# Patient Record
Sex: Female | Born: 1970 | Race: Black or African American | Hispanic: No | Marital: Single | Smoking: Former smoker
Health system: Southern US, Community
[De-identification: ages and names within clinical notes are randomized; demographics above are authoritative.]

## PROBLEM LIST (undated history)

## (undated) DIAGNOSIS — R079 Chest pain, unspecified: Secondary | ICD-10-CM

## (undated) DIAGNOSIS — E0590 Thyrotoxicosis, unspecified without thyrotoxic crisis or storm: Secondary | ICD-10-CM

## (undated) DIAGNOSIS — I10 Essential (primary) hypertension: Secondary | ICD-10-CM

## (undated) DIAGNOSIS — R0602 Shortness of breath: Secondary | ICD-10-CM

## (undated) HISTORY — DX: Chest pain, unspecified: R07.9

## (undated) HISTORY — PX: ECTOPIC PREGNANCY SURGERY: SHX613

## (undated) HISTORY — DX: Shortness of breath: R06.02

## (undated) HISTORY — DX: Thyrotoxicosis, unspecified without thyrotoxic crisis or storm: E05.90

---

## 2019-12-20 ENCOUNTER — Emergency Department (HOSPITAL_COMMUNITY)
Admission: EM | Admit: 2019-12-20 | Discharge: 2019-12-20 | Disposition: A | Discharge status: Home / Self Care | Payer: Self-pay | Attending: Emergency Medicine | Admitting: Emergency Medicine

## 2019-12-20 ENCOUNTER — Encounter (HOSPITAL_COMMUNITY): Payer: Self-pay | Admitting: Emergency Medicine

## 2019-12-20 ENCOUNTER — Emergency Department (HOSPITAL_COMMUNITY): Payer: Self-pay

## 2019-12-20 ENCOUNTER — Other Ambulatory Visit: Payer: Self-pay

## 2019-12-20 DIAGNOSIS — I209 Angina pectoris, unspecified: Secondary | ICD-10-CM | POA: Insufficient documentation

## 2019-12-20 DIAGNOSIS — I208 Other forms of angina pectoris: Secondary | ICD-10-CM

## 2019-12-20 DIAGNOSIS — I10 Essential (primary) hypertension: Secondary | ICD-10-CM | POA: Insufficient documentation

## 2019-12-20 LAB — BASIC METABOLIC PANEL
Anion gap: 9 (ref 5–15)
BUN: 11 mg/dL (ref 6–20)
CO2: 26 mmol/L (ref 22–32)
Calcium: 9.2 mg/dL (ref 8.9–10.3)
Chloride: 103 mmol/L (ref 98–111)
Creatinine, Ser: 0.6 mg/dL (ref 0.44–1.00)
GFR calc Af Amer: >60 mL/min (ref >60)
GFR calc non Af Amer: >60 mL/min (ref >60)
Glucose, Bld: 98 mg/dL (ref 70–99)
Potassium: 3.8 mmol/L (ref 3.5–5.1)
Sodium: 138 mmol/L (ref 135–145)

## 2019-12-20 LAB — TROPONIN I (HIGH SENSITIVITY)
Troponin I (High Sensitivity): 5 ng/L (ref <18)
Troponin I (High Sensitivity): 4 ng/L (ref <18)

## 2019-12-20 LAB — CBC
HCT: 34.9 % — ABNORMAL LOW (ref 36.0–46.0)
Hemoglobin: 11.4 g/dL — ABNORMAL LOW (ref 12.0–15.0)
MCH: 24.7 pg — ABNORMAL LOW (ref 26.0–34.0)
MCHC: 32.7 g/dL (ref 30.0–36.0)
MCV: 75.7 fL — ABNORMAL LOW (ref 80.0–100.0)
Platelets: 303 10*3/uL (ref 150–400)
RBC: 4.61 MIL/uL (ref 3.87–5.11)
RDW: 14.2 % (ref 11.5–15.5)
WBC: 5.4 10*3/uL (ref 4.0–10.5)
nRBC: 0 % (ref 0.0–0.2)

## 2019-12-20 LAB — I-STAT BETA HCG BLOOD, ED (MC, WL, AP ONLY): I-stat hCG, quantitative: <5 m[IU]/mL (ref <5)

## 2019-12-20 MED ORDER — METOPROLOL TARTRATE 50 MG PO TABS: 25.0000 mg | ORAL_TABLET | Freq: Two times a day (BID) | ORAL | 0 refills | Status: DC

## 2019-12-20 MED ORDER — ASPIRIN 81 MG PO CHEW
324.0000 mg | CHEWABLE_TABLET | Freq: Once | ORAL | Status: AC
  Administered 2019-12-20: 324 mg via ORAL
  Filled 2019-12-20: qty 4

## 2019-12-20 MED ORDER — METOPROLOL TARTRATE 25 MG PO TABS
25.0000 mg | ORAL_TABLET | Freq: Once | ORAL | Status: AC
  Administered 2019-12-20: 25 mg via ORAL
  Filled 2019-12-20: qty 1

## 2019-12-20 NOTE — ED Provider Notes (Signed)
[facility] EMERGENCY DEPARTMENT Provider Note   CSN: [id number] Arrival date & time: 12/20/19  1003     History Chief Complaint  Patient presents with  . Chest Pain  . Shortness of Breath    Courtney Lam is a 49 y.o. female.  HPI   49 yo female ho thyroid disease has been out of meds about 6 months.  She reports that she thinks that she was told she had hyperthyroidism.  She is unable to recall the name of the medication that she took.  She was treated at [facility].  These records are not readily available in epic.  Today her chief complaint is that she has been having chest pain for the past 2 weeks with ambulation.  She describes it as pressure and pulsating with ambulation.  She states it is worse the farther that she walks.  It does resolve with stopping.  She denies any associated symptoms such as diaphoresis, dyspnea, nausea, or vomiting.   Initial onset of pain was more than 6 hours ago. The patient's chest pain is described as heaviness/pressure/tightness and is worse with exertion. The patient's chest pain is middle- or left-sided, is not well-localized, is not sharp and does radiate to the arms/jaw/neck. The patient does not complain of nausea and denies diaphoresis. The patient has no history of stroke, has no history of peripheral artery disease, has not smoked in the past 90 days, denies any history of treated diabetes, has no relevant family history of coronary artery disease (first degree relative at less than age 25) and has no history of hypercholesterolemia.      History reviewed. No pertinent past medical history. Hypertension  There are no problems to display for this patient.   History reviewed. No pertinent surgical history.   OB History   No obstetric history on file.     No family history on file.  Social History   Tobacco Use  . Smoking status: Never Smoker  . Smokeless tobacco: Never Used  Substance Use Topics  . Alcohol  use: Not Currently  . Drug use: Not Currently    Home Medications Prior to Admission medications   Not on File    Allergies    Patient has no allergy information on record.  Review of Systems   Review of Systems  Physical Exam Updated Vital Signs BP (!) 127/95 (BP Location: Left Arm)   Pulse 82   Temp 99.1 F (37.3 C) (Oral)   Resp 14   LMP 12/13/2019   SpO2 100%   Physical Exam  ED Results / Procedures / Treatments   Labs (all labs ordered are listed, but only abnormal results are displayed) Labs Reviewed  CBC - Abnormal; Notable for the following components:      Result Value   Hemoglobin 11.4 (*)    HCT 34.9 (*)    MCV 75.7 (*)    MCH 24.7 (*)    All other components within normal limits  BASIC METABOLIC PANEL  I-STAT BETA HCG BLOOD, ED ([facility], [facility], [facility] ONLY)  TROPONIN I (HIGH SENSITIVITY)  TROPONIN I (HIGH SENSITIVITY)    EKG EKG Interpretation  Date/Time:  Saturday December 20 2019 10:38:38 EDT Ventricular Rate:  84 PR Interval:  138 QRS Duration: 86 QT Interval:  354 QTC Calculation: 418 R Axis:   29 Text Interpretation: Normal sinus rhythm Possible Anterior infarct , age undetermined Abnormal ECG Confirmed by Margarita Grizzle [phone]) on 12/20/2019 2:30:21 PM   Radiology DG  Chest 2 View  Result Date: 12/20/2019 CLINICAL DATA:  Chest pain EXAM: CHEST - 2 VIEW COMPARISON:  None FINDINGS: The heart size and mediastinal contours are within normal limits. Both lungs are clear. The visualized skeletal structures are unremarkable. IMPRESSION: No active cardiopulmonary disease. Electronically Signed   By: Signa Kell M.D.   On: 12/20/2019 11:46    Procedures Procedures (including critical care time)  Medications Ordered in ED Medications - No data to display  ED Course  I have reviewed the triage vital signs and the nursing notes.  Pertinent labs & imaging results that were available during my care of the patient were reviewed by me and considered in my  medical decision making (see chart for details).    MDM Rules/Calculators/A&P                          49 year old female presents today with chest pain on exertion.  Resting EKG and troponin normal here.  Repeat troponin is pending.  Heart score is 4. Discussed with cardiology on-call.  Plan aspirin and beta-blocker and no exertional activity.  They will arrange for outpatient stress testing. Care discussed with patient and she will voices understanding of plan and follow up.  Final Clinical Impression(s) / ED Diagnoses Final diagnoses:  Exertional angina [facility])    Rx / DC Orders ED Discharge Orders    None       Margarita Grizzle, MD 12/20/19 1540

## 2019-12-20 NOTE — ED Notes (Signed)
Patient Alert and oriented to baseline. Stable and ambulatory to baseline. Patient verbalized understanding of the discharge instructions.  Patient belongings were taken by the patient.   

## 2019-12-20 NOTE — Care Management (Signed)
Community health and wellness in patient instructions to call on Monday for appointment.

## 2019-12-20 NOTE — ED Triage Notes (Signed)
Pt. Stated, Im new here and Ive not had my thyroid medication refilled in 6 months . I dont have a Dr. . Courtney Lam been having chest pain especially when I walk.

## 2019-12-20 NOTE — Discharge Instructions (Addendum)
Please take medication as prescribed. Take an aspirin daily. Do not do any exertional activity. Call the number on your discharge for follow up primary care. Please keep above appointment for cardiology evaluation. Return to emergency department if pain recurs and does not resolve with rest.

## 2020-01-01 ENCOUNTER — Encounter: Payer: Self-pay | Admitting: Internal Medicine

## 2020-01-01 ENCOUNTER — Other Ambulatory Visit: Payer: Self-pay

## 2020-01-01 ENCOUNTER — Ambulatory Visit (INDEPENDENT_AMBULATORY_CARE_PROVIDER_SITE_OTHER): Payer: Self-pay | Admitting: Internal Medicine

## 2020-01-01 VITALS — BP 124/68 | HR 78 | Ht 66.0 in | Wt 224.4 lb

## 2020-01-01 DIAGNOSIS — I209 Angina pectoris, unspecified: Secondary | ICD-10-CM | POA: Insufficient documentation

## 2020-01-01 DIAGNOSIS — E0590 Thyrotoxicosis, unspecified without thyrotoxic crisis or storm: Secondary | ICD-10-CM | POA: Insufficient documentation

## 2020-01-01 DIAGNOSIS — R0602 Shortness of breath: Secondary | ICD-10-CM | POA: Insufficient documentation

## 2020-01-01 DIAGNOSIS — R071 Chest pain on breathing: Secondary | ICD-10-CM | POA: Insufficient documentation

## 2020-01-01 LAB — LIPID PANEL
Chol/HDL Ratio: 3.3 ratio (ref 0.0–4.4)
Cholesterol, Total: 114 mg/dL (ref 100–199)
HDL: 35 mg/dL — ABNORMAL LOW (ref >39)
LDL Chol Calc (NIH): 68 mg/dL (ref 0–99)
Triglycerides: 47 mg/dL (ref 0–149)
VLDL Cholesterol Cal: 11 mg/dL (ref 5–40)

## 2020-01-01 MED ORDER — PANTOPRAZOLE SODIUM 40 MG PO TBEC: 40.0000 mg | DELAYED_RELEASE_TABLET | Freq: Every day | ORAL | 11 refills | Status: DC

## 2020-01-01 NOTE — Progress Notes (Signed)
Cardiology Office Note:    Date:  01/01/2020   ID:  Courtney Lam, DOB Jan 12, 1971, MRN [id number]  PCP:  Patient, No Pcp Per  [facility] Cardiologist:  No primary care provider on file.  [facility] Electrophysiologist:  None   Referring MD: No ref. provider found; None per patient  No chief complaint on file. Chest Pain  History of Present Illness:    Courtney Lam is a 49 y.o. female who presents after recent evaluation 12/20/19 for angina.  Patient notes that she carries a recent diagnosis of hyperthyroidism and one of her symptoms was chest pain.  Patient was getting medication for hyperthyroidism and chest pain resolved.  May or may not have been on methimazole.  Her pain wakes her from sleep.  A "hurting pain," that occurs with each heart beat.   Chest pain takes her breath away and can make her cough.  Does not have SOB, DOE, or cough outside of cough.  Also has occasional dysphagia with spicy foods but not with soft foods.  Prior notes: Anginal eval at [facility].  Pressure and pulsating worse with exertion. Eval through [facility]  hscTrop 5, 4.  Past Medical History:  Diagnosis Date  . Chest pain   . Hyperthyroidism   . SOB (shortness of breath)     Past Surgical History:  Procedure Laterality Date  . ECTOPIC PREGNANCY SURGERY      Current Medications: Current Meds  Medication Sig  . amLODipine (NORVASC) 5 MG tablet Take by mouth.  . levocetirizine (XYZAL) 5 MG tablet Take 5 mg by mouth at bedtime as needed.  . metoprolol tartrate (LOPRESSOR) 50 MG tablet Take 0.5 tablets (25 mg total) by mouth 2 (two) times daily.  Marland Kitchen triamcinolone ointment (KENALOG) 0.1 % Apply topically.     Allergies:   Patient has no known allergies.   Social History   Socioeconomic History  . Marital status: Single    Spouse name: Not on file  . Number of children: Not on file  . Years of education: Not on file  . Highest education level: Not on file  Occupational History  . Not  on file  Tobacco Use  . Smoking status: Former Games developer  . Smokeless tobacco: Never Used  Substance and Sexual Activity  . Alcohol use: Not Currently  . Drug use: Not Currently  . Sexual activity: Not on file  Other Topics Concern  . Not on file  Social History Narrative  . Not on file   Social Determinants of Health   Financial Resource Strain:   . Difficulty of Paying Living Expenses: Not on file  Food Insecurity:   . Worried About Programme researcher, broadcasting/film/video in the Last Year: Not on file  . Ran Out of Food in the Last Year: Not on file  Transportation Needs:   . Lack of Transportation (Medical): Not on file  . Lack of Transportation (Non-Medical): Not on file  Physical Activity:   . Days of Exercise per Week: Not on file  . Minutes of Exercise per Session: Not on file  Stress:   . Feeling of Stress : Not on file  Social Connections:   . Frequency of Communication with Friends and Family: Not on file  . Frequency of Social Gatherings with Friends and Family: Not on file  . Attends Religious Services: Not on file  . Active Member of Clubs or Organizations: Not on file  . Attends Banker Meetings: Not on file  .  Marital Status: Not on file     Family History: The patient's family history includes Asthma in her sister; Cancer (age of onset: 53) in her mother; Diabetes in her father; High blood pressure in her father; Thyroid disease in her brother and sister. Denies history of heart disease in family, but not sure if her father had PCI in the last year (has many medical problems).  ROS:   Please see the history of present illness.   Patient also notes productive shortness of breath and a cough that is chronic. No COVID exposures. All other systems reviewed and are negative.  EKGs/Labs/Other Studies Reviewed:    The following studies were reviewed today:  EKG:  EKG is sinus rhythm, sinus rhythm, rate 78 possible anterior infarct pattern, borderline LVH.   8/30 EKG  sinus rhythm rate 80, LVH, anterior infarct pattern  Recent Labs: 12/20/2019: BUN 11; Creatinine, Ser 0.60; Hemoglobin 11.4; Platelets 303; Potassium 3.8; Sodium 138  Recent Lipid Panel No results found for: CHOL, TRIG, HDL, CHOLHDL, VLDL, LDLCALC, LDLDIRECT  Physical Exam:    VS:  BP 124/68 (BP Location: Left Arm, Patient Position: Sitting, Cuff Size: Large)   Pulse 78   Ht 5\' 6"  (1.676 m)   Wt 224 lb 6.4 oz (101.8 kg)   LMP 12/13/2019   SpO2 100%   BMI 36.22 kg/m     Wt Readings from Last 3 Encounters:  01/01/20 224 lb 6.4 oz (101.8 kg)    GEN: Obese Well nourished, well developed in no acute distress HEENT: Normal NECK: No JVD; No carotid bruits LYMPHATICS: No lymphadenopathy CARDIAC: RRR,  III/VI holosystolic murmur heard throughout, radiates to axilla and neck RESPIRATORY:  Clear to auscultation without rales, wheezing or rhonchi  ABDOMEN: Soft, non-tender, non-distended MUSCULOSKELETAL:  No edema; No deformity  SKIN: Warm and dry NEUROLOGIC:  Alert and oriented x 3 PSYCHIATRIC:  Normal affect   ASSESSMENT:    1. Angina pectoris (HCC)   2. Chest pain on breathing   3. Shortness of breath    PLAN:    In order of problems listed above:  1. Anginal Chest pain - in the setting of essential hypertension on norvasc; hx of MI in father, hyperthyroidism - would recommend non-invasive testing but due to social determinants of health/lack of insurance, we are working to help her get insurance support. - will check lipid panel - will trial PPI - will get her plugged in with community health and attempt [facility] Orange Card as she would not be able to afford stress testing at this time 2. Shortness of breath with new systolic murmur - will get echocardiogram; currently euvolemic:  No new changes in medications at this time; getting echo after orange card 3.  Essential hypertension - controlled on present medications 4. Hyperthyroidism - getting established with PCP and  Endo  Will see in 3 months; 60 minutes spent coordinating care including support with lack of financial resources.   Medication Adjustments/Labs and Tests Ordered: Current medicines are reviewed at length with the patient today.  Concerns regarding medicines are outlined above.  Orders Placed This Encounter  Procedures  . CT CORONARY MORPH W/CTA COR W/SCORE W/CA W/CM &/OR WO/CM  . CT CORONARY FRACTIONAL FLOW RESERVE DATA PREP  . CT CORONARY FRACTIONAL FLOW RESERVE FLUID ANALYSIS  . EKG 12-Lead  . ECHOCARDIOGRAM COMPLETE   No orders of the defined types were placed in this encounter.   There are no Patient Instructions on file for this visit.  Signed, Christell Constant, MD  01/01/2020 8:46 AM    [facility]

## 2020-01-01 NOTE — Patient Instructions (Addendum)
Medication Instructions:  Your physician has recommended you make the following change in your medication:  1. Start Protonix one tablet by mouth (40 mg ) daily. Sent in today # 30 to pharmacy.    *If you need a refill on your cardiac medications before your next appointment, please call your pharmacy*   Lab Work: Your physician recommends that you have lab work today: lipid  If you have labs (blood work) drawn today and your tests are completely normal, you will receive your results only by: Marland Kitchen MyChart Message (if you have MyChart) OR . A paper copy in the mail If you have any lab test that is abnormal or we need to change your treatment, we will call you to review the results.    Follow-Up: At [facility], you and your health needs are our priority.  As part of our continuing mission to provide you with exceptional heart care, we have created designated Provider Care Teams.  These Care Teams include your primary Cardiologist (physician) and Advanced Practice Providers (APPs -  Physician Assistants and Nurse Practitioners) who all work together to provide you with the care you need, when you need it.  We recommend signing up for the patient portal called "MyChart".  Sign up information is provided on this After Visit Summary.  MyChart is used to connect with patients for Virtual Visits (Telemedicine).  Patients are able to view lab/test results, encounter notes, upcoming appointments, etc.  Non-urgent messages can be sent to your provider as well.   To learn more about what you can do with MyChart, go to [email or web address].    Your next appointment:   3 month(s)  The format for your next appointment:   In Person  Provider:   Dr. Izora Ribas   Other Instructions Your physician has requested that you have an echocardiogram. Echocardiography is a painless test that uses sound waves to create images of your heart. It provides your doctor with information about the size and  shape of your heart and how well your heart's chambers and valves are working. This procedure takes approximately one hour. There are no restrictions for this procedure.

## 2020-01-02 ENCOUNTER — Encounter: Payer: Self-pay | Admitting: *Deleted

## 2020-01-05 ENCOUNTER — Telehealth: Payer: Self-pay | Admitting: General Practice

## 2020-01-05 NOTE — Telephone Encounter (Signed)
Copied from CRM [id number]. Topic: Appointment Scheduling - Scheduling Inquiry for Clinic >> Jan 05, 2020 12:09 PM Wyonia Hough E wrote: Reason for CRM: pt would like an appt with Courtney Lam for financial assistance due to not having insurance / please advise

## 2020-01-05 NOTE — Telephone Encounter (Signed)
I return pt called, 1st Pt need to be an establish Pt with the clinic to apply for the [facility] financial programs, an her appt is not until 02/2020, she need to see the PCP fisrt

## 2020-01-16 ENCOUNTER — Other Ambulatory Visit (HOSPITAL_COMMUNITY): Payer: Medicaid Other

## 2020-01-17 ENCOUNTER — Other Ambulatory Visit: Payer: Self-pay

## 2020-01-17 ENCOUNTER — Emergency Department (HOSPITAL_COMMUNITY)
Admission: EM | Admit: 2020-01-17 | Discharge: 2020-01-17 | Disposition: A | Discharge status: Home / Self Care | Payer: Medicaid Other | Attending: Emergency Medicine | Admitting: Emergency Medicine

## 2020-01-17 ENCOUNTER — Emergency Department (HOSPITAL_COMMUNITY): Payer: Medicaid Other

## 2020-01-17 DIAGNOSIS — R079 Chest pain, unspecified: Secondary | ICD-10-CM | POA: Insufficient documentation

## 2020-01-17 DIAGNOSIS — Z5321 Procedure and treatment not carried out due to patient leaving prior to being seen by health care provider: Secondary | ICD-10-CM | POA: Insufficient documentation

## 2020-01-17 LAB — BASIC METABOLIC PANEL
Anion gap: 10 (ref 5–15)
BUN: 14 mg/dL (ref 6–20)
CO2: 24 mmol/L (ref 22–32)
Calcium: 8.9 mg/dL (ref 8.9–10.3)
Chloride: 105 mmol/L (ref 98–111)
Creatinine, Ser: 0.5 mg/dL (ref 0.44–1.00)
GFR calc Af Amer: >60 mL/min (ref >60)
GFR calc non Af Amer: >60 mL/min (ref >60)
Glucose, Bld: 111 mg/dL — ABNORMAL HIGH (ref 70–99)
Potassium: 4 mmol/L (ref 3.5–5.1)
Sodium: 139 mmol/L (ref 135–145)

## 2020-01-17 LAB — CBC
HCT: 32.8 % — ABNORMAL LOW (ref 36.0–46.0)
Hemoglobin: 10.8 g/dL — ABNORMAL LOW (ref 12.0–15.0)
MCH: 25.2 pg — ABNORMAL LOW (ref 26.0–34.0)
MCHC: 32.9 g/dL (ref 30.0–36.0)
MCV: 76.5 fL — ABNORMAL LOW (ref 80.0–100.0)
Platelets: 312 10*3/uL (ref 150–400)
RBC: 4.29 MIL/uL (ref 3.87–5.11)
RDW: 13.9 % (ref 11.5–15.5)
WBC: 6.8 10*3/uL (ref 4.0–10.5)
nRBC: 0 % (ref 0.0–0.2)

## 2020-01-17 LAB — TROPONIN I (HIGH SENSITIVITY): Troponin I (High Sensitivity): 4 ng/L (ref <18)

## 2020-01-17 NOTE — ED Notes (Signed)
Pt states she has to watch her daughters daughter so she will just go to urgent care later

## 2020-01-17 NOTE — ED Triage Notes (Signed)
Pt said she has hyperthyroidism and for 1 month Pt said the chest pain has gotten worse and starts in her throat down to her chest. Pt said no N/V. Pt said the pain does not radiate.

## 2020-02-21 ENCOUNTER — Emergency Department (HOSPITAL_COMMUNITY): Payer: Medicaid Other

## 2020-02-21 ENCOUNTER — Emergency Department (HOSPITAL_COMMUNITY)
Admission: EM | Admit: 2020-02-21 | Discharge: 2020-02-21 | Disposition: A | Discharge status: Home / Self Care | Payer: Medicaid Other | Attending: Emergency Medicine | Admitting: Emergency Medicine

## 2020-02-21 ENCOUNTER — Encounter (HOSPITAL_COMMUNITY): Payer: Self-pay | Admitting: Emergency Medicine

## 2020-02-21 ENCOUNTER — Other Ambulatory Visit: Payer: Self-pay

## 2020-02-21 DIAGNOSIS — Z79899 Other long term (current) drug therapy: Secondary | ICD-10-CM | POA: Insufficient documentation

## 2020-02-21 DIAGNOSIS — E0590 Thyrotoxicosis, unspecified without thyrotoxic crisis or storm: Secondary | ICD-10-CM

## 2020-02-21 DIAGNOSIS — E049 Nontoxic goiter, unspecified: Secondary | ICD-10-CM

## 2020-02-21 DIAGNOSIS — E0500 Thyrotoxicosis with diffuse goiter without thyrotoxic crisis or storm: Secondary | ICD-10-CM | POA: Insufficient documentation

## 2020-02-21 DIAGNOSIS — R0789 Other chest pain: Secondary | ICD-10-CM | POA: Insufficient documentation

## 2020-02-21 DIAGNOSIS — Z87891 Personal history of nicotine dependence: Secondary | ICD-10-CM | POA: Insufficient documentation

## 2020-02-21 HISTORY — DX: Essential (primary) hypertension: I10

## 2020-02-21 LAB — HEPATIC FUNCTION PANEL
ALT: 22 U/L (ref 0–44)
AST: 24 U/L (ref 15–41)
Albumin: 3 g/dL — ABNORMAL LOW (ref 3.5–5.0)
Alkaline Phosphatase: 80 U/L (ref 38–126)
Bilirubin, Direct: 0.2 mg/dL (ref 0.0–0.2)
Indirect Bilirubin: 0.6 mg/dL (ref 0.3–0.9)
Total Bilirubin: 0.8 mg/dL (ref 0.3–1.2)
Total Protein: 6.8 g/dL (ref 6.5–8.1)

## 2020-02-21 LAB — BASIC METABOLIC PANEL
Anion gap: 11 (ref 5–15)
BUN: 11 mg/dL (ref 6–20)
CO2: 24 mmol/L (ref 22–32)
Calcium: 8.9 mg/dL (ref 8.9–10.3)
Chloride: 104 mmol/L (ref 98–111)
Creatinine, Ser: 0.47 mg/dL (ref 0.44–1.00)
GFR, Estimated: >60 mL/min (ref >60)
Glucose, Bld: 104 mg/dL — ABNORMAL HIGH (ref 70–99)
Potassium: 3.1 mmol/L — ABNORMAL LOW (ref 3.5–5.1)
Sodium: 139 mmol/L (ref 135–145)

## 2020-02-21 LAB — CBC
HCT: 30.8 % — ABNORMAL LOW (ref 36.0–46.0)
Hemoglobin: 10.1 g/dL — ABNORMAL LOW (ref 12.0–15.0)
MCH: 24.1 pg — ABNORMAL LOW (ref 26.0–34.0)
MCHC: 32.8 g/dL (ref 30.0–36.0)
MCV: 73.5 fL — ABNORMAL LOW (ref 80.0–100.0)
Platelets: 240 10*3/uL (ref 150–400)
RBC: 4.19 MIL/uL (ref 3.87–5.11)
RDW: 14.1 % (ref 11.5–15.5)
WBC: 6.2 10*3/uL (ref 4.0–10.5)
nRBC: 0 % (ref 0.0–0.2)

## 2020-02-21 LAB — I-STAT BETA HCG BLOOD, ED (MC, WL, AP ONLY): I-stat hCG, quantitative: <5 m[IU]/mL (ref <5)

## 2020-02-21 LAB — BRAIN NATRIURETIC PEPTIDE: B Natriuretic Peptide: 414.3 pg/mL — ABNORMAL HIGH (ref 0.0–100.0)

## 2020-02-21 LAB — TROPONIN I (HIGH SENSITIVITY)
Troponin I (High Sensitivity): 6 ng/L (ref <18)
Troponin I (High Sensitivity): 5 ng/L (ref <18)

## 2020-02-21 LAB — TSH: TSH: <0.01 u[IU]/mL — ABNORMAL LOW (ref 0.350–4.500)

## 2020-02-21 LAB — T4, FREE: Free T4: >5.5 ng/dL — ABNORMAL HIGH (ref 0.61–1.12)

## 2020-02-21 MED ORDER — IOHEXOL 300 MG/ML  SOLN
75.0000 mL | Freq: Once | INTRAMUSCULAR | Status: AC | PRN
  Administered 2020-02-21: 75 mL via INTRAVENOUS

## 2020-02-21 MED ORDER — POTASSIUM CHLORIDE CRYS ER 20 MEQ PO TBCR
40.0000 meq | EXTENDED_RELEASE_TABLET | Freq: Once | ORAL | Status: AC
  Administered 2020-02-21: 40 meq via ORAL
  Filled 2020-02-21: qty 2

## 2020-02-21 MED ORDER — LIDOCAINE VISCOUS HCL 2 % MT SOLN
15.0000 mL | Freq: Once | OROMUCOSAL | Status: AC
  Administered 2020-02-21: 15 mL via ORAL
  Filled 2020-02-21: qty 15

## 2020-02-21 MED ORDER — ALUM & MAG HYDROXIDE-SIMETH 200-200-20 MG/5ML PO SUSP
30.0000 mL | Freq: Once | ORAL | Status: AC
  Administered 2020-02-21: 30 mL via ORAL
  Filled 2020-02-21: qty 30

## 2020-02-21 MED ORDER — FAMOTIDINE 20 MG PO TABS: 20.0000 mg | ORAL_TABLET | Freq: Two times a day (BID) | ORAL | 0 refills | Status: DC

## 2020-02-21 MED ORDER — FAMOTIDINE 20 MG PO TABS
20.0000 mg | ORAL_TABLET | Freq: Once | ORAL | Status: AC
  Administered 2020-02-21: 20 mg via ORAL
  Filled 2020-02-21: qty 1

## 2020-02-21 MED ORDER — METOPROLOL TARTRATE 50 MG PO TABS: 25.0000 mg | ORAL_TABLET | Freq: Two times a day (BID) | ORAL | 0 refills | Status: DC

## 2020-02-21 NOTE — ED Provider Notes (Signed)
[facility] EMERGENCY DEPARTMENT Provider Note   CSN: [id number] Arrival date & time: 02/21/20  4098     History Chief Complaint  Patient presents with  . Chest Pain    Courtney Lam is a 49 y.o. female.  HPI Patient is a 49 year old female who presents to the emergency department due to chest pain.  Patient reports to a history of similar symptoms.  She states her symptoms typically occur with exertion and seem to occur on a daily basis.  States the pain is in her central chest.  Also notes it worsens when lying flat.  Symptoms also seem to worsen at night when asleep in bed.  Reports increased coughing when eating recently.  She was evaluated for this in the past and started on metoprolol but denies any significant improvement.  She states she was on another medication for her hyperthyroidism but is unsure of the name of this medication.  She states that this used to help her chest pain.  She moved to this region about 6 months ago and we do not readily have her old medical records available.  She has been attempting to get an appointment with [facility] community [facility] wellness and has an upcoming appointment on November 18.  She denies any current chest pain.  No shortness of breath, abdominal pain, fevers, chills, sore throat, congestion, vomiting, diarrhea, dysuria, syncope.  Patient was evaluated by cardiology on September 9 and was started on Protonix but patient denies that she has ever taken this medication.     Past Medical History:  Diagnosis Date  . Chest pain   . Hyperthyroidism   . SOB (shortness of breath)     Patient Active Problem List   Diagnosis Date Noted  . Chest pain on breathing 01/01/2020  . Angina pectoris (HCC) 01/01/2020  . Shortness of breath 01/01/2020  . Hyperthyroidism 01/01/2020    Past Surgical History:  Procedure Laterality Date  . ECTOPIC PREGNANCY SURGERY       OB History   No obstetric history on file.     Family  History  Problem Relation Age of Onset  . Cancer Mother 76  . High blood pressure Father   . Diabetes Father   . Asthma Sister   . Thyroid disease Brother   . Thyroid disease Sister    Social History   Tobacco Use  . Smoking status: Former Games developer  . Smokeless tobacco: Never Used  Substance Use Topics  . Alcohol use: Not Currently  . Drug use: Not Currently    Home Medications Prior to Admission medications   Medication Sig Start Date End Date Taking? Authorizing Provider  amLODipine (NORVASC) 5 MG tablet Take by mouth. 08/11/19   [provider]  levocetirizine (XYZAL) 5 MG tablet Take 5 mg by mouth at bedtime as needed. 11/03/19   [provider]  metoprolol tartrate (LOPRESSOR) 50 MG tablet Take 0.5 tablets (25 mg total) by mouth 2 (two) times daily. 12/20/19   Margarita Grizzle, MD  pantoprazole (PROTONIX) 40 MG tablet Take 1 tablet (40 mg total) by mouth daily. 01/01/20   Chandrasekhar, Mahesh A, MD  triamcinolone ointment (KENALOG) 0.1 % Apply topically. 08/11/19   [provider]    Allergies    Patient has no known allergies.  Review of Systems   Review of Systems  All other systems reviewed and are negative. Ten systems reviewed and are negative for acute change, except as noted in the HPI.  Physical Exam Updated Vital Signs BP (!) 151/54 (BP Location: Right Arm)   Pulse 74   Temp 98.3 F (36.8 C) (Oral)   Resp 16   SpO2 100%   Physical Exam Vitals and nursing note reviewed.  Constitutional:      General: She is not in acute distress.    Appearance: Normal appearance. She is well-developed. She is not ill-appearing, toxic-appearing or diaphoretic.  HENT:     Head: Normocephalic and atraumatic.     Right Ear: External ear normal.     Left Ear: External ear normal.     Nose: Nose normal.     Mouth/Throat:     Mouth: Mucous membranes are moist.     Pharynx: Oropharynx is clear. No oropharyngeal exudate or posterior oropharyngeal  erythema.  Eyes:     Extraocular Movements: Extraocular movements intact.  Cardiovascular:     Rate and Rhythm: Normal rate and regular rhythm.     Pulses: Normal pulses.          Radial pulses are 2+ on the right side and 2+ on the left side.       Dorsalis pedis pulses are 2+ on the right side and 2+ on the left side.     Heart sounds: Murmur heard.  Systolic murmur is present.  No friction rub. No gallop.   Pulmonary:     Effort: Pulmonary effort is normal. No tachypnea, accessory muscle usage or respiratory distress.     Breath sounds: Normal breath sounds. No stridor. No decreased breath sounds, wheezing, rhonchi or rales.  Abdominal:     General: Abdomen is flat.     Palpations: Abdomen is soft.     Tenderness: There is no abdominal tenderness.  Musculoskeletal:        General: Normal range of motion.     Cervical back: Normal range of motion and neck supple. No tenderness.     Right lower leg: Edema present.     Left lower leg: Edema present.     Comments: 1+ non pitting symmetrical edema noted in the BLEs.  Skin:    General: Skin is warm and dry.  Neurological:     General: No focal deficit present.     Mental Status: She is alert and oriented to person, place, and time.  Psychiatric:        Mood and Affect: Mood normal.        Behavior: Behavior normal.    ED Results / Procedures / Treatments   Labs (all labs ordered are listed, but only abnormal results are displayed) Labs Reviewed  BASIC METABOLIC PANEL - Abnormal; Notable for the following components:      Result Value   Potassium 3.1 (*)    Glucose, Bld 104 (*)    All other components within normal limits  CBC - Abnormal; Notable for the following components:   Hemoglobin 10.1 (*)    HCT 30.8 (*)    MCV 73.5 (*)    MCH 24.1 (*)    All other components within normal limits  TSH - Abnormal; Notable for the following components:   TSH <0.010 (*)    All other components within normal limits  T4, FREE -  Abnormal; Notable for the following components:   Free T4 >5.50 (*)    All other components within normal limits  BRAIN NATRIURETIC PEPTIDE - Abnormal; Notable for the following components:   B Natriuretic Peptide 414.3 (*)    All other components within  normal limits  HEPATIC FUNCTION PANEL - Abnormal; Notable for the following components:   Albumin 3.0 (*)    All other components within normal limits  I-STAT BETA HCG BLOOD, ED ([facility], [facility], [facility] ONLY)  TROPONIN I (HIGH SENSITIVITY)  TROPONIN I (HIGH SENSITIVITY)   EKG EKG Interpretation  Date/Time:  Saturday February 21 2020 09:50:19 EDT Ventricular Rate:  82 PR Interval:  144 QRS Duration: 80 QT Interval:  364 QTC Calculation: 425 R Axis:   52 Text Interpretation: Normal sinus rhythm Possible Anterior infarct , age undetermined Abnormal ECG Confirmed by Kristine RoyalMessick, Peter [phone]([id number]) on 02/21/2020 10:56:05 AM  Radiology DG Chest 2 View  Addendum Date: 02/21/2020   ADDENDUM REPORT: 02/21/2020 10:57 ADDENDUM: These results were called by telephone at the time of interpretation on 02/21/2020 at 10:57 am to provider Kristine RoyalPeter Messick, who verbally acknowledged these results. Electronically Signed   By: Richarda OverlieAdam  Henn M.D.   On: 02/21/2020 10:57   Result Date: 02/21/2020 CLINICAL DATA:  Chest pain. EXAM: CHEST - 2 VIEW COMPARISON:  01/17/2020 and 12/20/2019 FINDINGS: Increased soft tissue density in the azygos vein region compared to the exam from 12/20/2019. Findings could be related to vascular congestion but difficult to exclude soft tissue thickening or lymphadenopathy in this area. Heart size is within normal limits. No overt pulmonary edema. No focal airspace disease. No pleural effusions. IMPRESSION: Increased soft tissue fullness in the right paratracheal region and azygos shadow region. This findings could be related to vascular congestion but cannot exclude lymphadenopathy. Recommend further characterization with a chest CT with IV contrast.  Electronically Signed: By: Richarda OverlieAdam  Henn M.D. On: 02/21/2020 10:48   CT Chest W Contrast  Result Date: 02/21/2020 CLINICAL DATA:  Neck mass.  Initial workup. EXAM: CT CHEST WITH CONTRAST TECHNIQUE: Multidetector CT imaging of the chest was performed during intravenous contrast administration. CONTRAST:  75mL OMNIPAQUE IOHEXOL 300 MG/ML  SOLN COMPARISON:  Chest x-ray on 10/30 21 FINDINGS: Cardiovascular: Heart size is normal. There is minimal atherosclerotic calcification of the coronary arteries. No pericardial effusion. The thoracic aorta and pulmonary arteries have normal appearance accounting for the contrast bolus timing. Mediastinum/Nodes: There is soft tissue density in the prevascular region consistent with thymic enlargement or adenopathy. This density measures 2.2 x 5.5 centimeters on image 75 of series 3. No significant hilar or axillary adenopathy. The thyroid is enlarged, measuring at least 5.4 x 6.2 centimeters with thickened isthmus. No discrete thyroid lesion identified. Thyroid is not contiguous with the substernal mass. Esophagus is normal in appearance. Lungs/Pleura: There is minimal subsegmental atelectasis at the LEFT lung base. No pleural effusions or consolidations. No suspicious nodules. No edema. Upper Abdomen: No acute abnormality. Musculoskeletal: No chest wall abnormality. No acute or significant osseous findings. IMPRESSION: 1. Enlarged thyroid gland with thickened isthmus. No discrete thyroid lesion identified. Findings are consistent with goiter. 2. Recommend thyroid US (ref: J Am Coll Radiol. 2015 Feb;12(2): 143-50). 3. Thymic enlargement or adenopathy in the anterior mediastinum. Recommend further evaluation with PET-CT. 4. Consider further evaluation with thyroid ultrasound. 5. Coronary artery disease. 6. Aortic Atherosclerosis (ICD10-I70.0). Electronically Signed   By: Norva PavlovElizabeth  Brown M.D.   On: 02/21/2020 12:32   US THYROID  Result Date: 02/21/2020 CLINICAL DATA:  Goiter  EXAM: THYROID ULTRASOUND TECHNIQUE: Ultrasound examination of the thyroid gland and adjacent soft tissues was performed. COMPARISON:  None. FINDINGS: Parenchymal Echotexture: Markedly heterogenous Isthmus: 1.3 cm Right lobe: 5.6 x 3.1 x 2.6 cm Left lobe: 6.3 x 3.3 x 2.4 cm _________________________________________________________  Estimated total number of nodules >/= 1 cm: 0 Number of spongiform nodules >/=  2 cm not described below (TR1): 0 Number of mixed cystic and solid nodules >/= 1.5 cm not described below (TR2): 0 _________________________________________________________ No discrete nodules are seen within the thyroid gland. IMPRESSION: Markedly heterogeneous and enlarged thyroid gland without evidence for distinct thyroid nodule. The above is in keeping with the ACR TI-RADS recommendations - J Am Coll Radiol 2017;14:587-595. Electronically Signed   By: Katherine Mantle M.D.   On: 02/21/2020 15:19   Procedures Procedures   Medications Ordered in ED Medications  famotidine (PEPCID) tablet 20 mg (20 mg Oral Given 02/21/20 1110)  alum & mag hydroxide-simeth (MAALOX/MYLANTA) 200-200-20 MG/5ML suspension 30 mL (30 mLs Oral Given 02/21/20 1111)    And  lidocaine (XYLOCAINE) 2 % viscous mouth solution 15 mL (15 mLs Oral Given 02/21/20 1111)   ED Course  I have reviewed the triage vital signs and the nursing notes.  Pertinent labs & imaging results that were available during my care of the patient were reviewed by me and considered in my medical decision making (see chart for details).  Clinical Course as of Feb 20 1510  Sat Feb 21, 2020  1049 Hemoglobin(!): 10.1 [LJ]  1209 Will give Klor-Con  Potassium(!): 3.1 [LJ]  1209 TSH(!): <0.010 [LJ]  1235 T4,Free(Direct)(!): >5.50 [LJ]  1235 B Natriuretic Peptide(!): 414.3 [LJ]  1446 Troponin I (High Sensitivity): 5 [LJ]    Clinical Course User Index [LJ] Placido Sou, PA-C   MDM Rules/Calculators/A&P                          Patient is  a 49 year old female who presents to the emergency department with intermittent chest pain.  Symptoms have been ongoing for a long time.  States they happen on a daily basis.  Seem to worsen with exertion but also when lying flat.  She was evaluated by cardiology in September and started on Protonix which she never took.  Recommended she be seen by community health and wellness prior to further work-up given her insurance needs and financial issues.  Patient has 1+ edema in the lower extremities which has been ongoing for the past few weeks.  I obtained a BNP which was elevated around 414.3.  Chest x-ray with findings as noted below.  Increased soft tissue fullness in the right paratracheal region and  azygos shadow region. This findings could be related to vascular  congestion but cannot exclude lymphadenopathy. Recommend further  characterization with a chest CT with IV contrast.   Based on request by radiology I obtained a chest CT with findings as noted below.  These findings were discussed with the patient in detail and she knows that she needs to follow-up for PET CT of the chest.  1. Enlarged thyroid gland with thickened isthmus. No discrete  thyroid lesion identified. Findings are consistent with goiter.  2. Recommend thyroid US (ref: J Am Coll Radiol. 2015 Feb;12(2):  143-50).  3. Thymic enlargement or adenopathy in the anterior mediastinum.  Recommend further evaluation with PET-CT.  4. Consider further evaluation with thyroid ultrasound.  5. Coronary artery disease.  6. Aortic Atherosclerosis (ICD10-I70.0).   I then obtained an ultrasound of the thyroid with findings as noted below.  Markedly heterogeneous and enlarged thyroid gland without evidence  for distinct thyroid nodule.    The above is in keeping with the ACR TI-RADS recommendations - J Am  Coll Radiol  2017;14:587-595.   Patient mildly hypokalemic with a potassium of 3.1 which was repleted with Klor-Con.  Given her  history of hyperthyroidism I repeated her TSH and free T4.  TSH decreased at less than 0.010 and her free T4 elevated greater than 5.5.  No tachycardia.  Patient is afebrile.  Calm and behaving appropriately. Based on Burch and Wartofsky diagnostic criteria for thyroid storm, exam not concerning for thyroid storm at this time.  No elevation in troponin x2.  Patient has a follow-up appointment with [facility] in 2 weeks.  I urged patient to make sure that she keeps his appointment.  Also recommended that she follow-up with her cardiologist regarding her symptoms as well as this visit.  She states that the Pepcid as well as a GI cocktail help with her symptoms so we will start patient on a course of Pepcid.  Patient also requests a refill of her metoprolol.  We will also provide this.  Her questions were answered and she was amicable at the time of discharge.   An After Visit Summary was printed and given to the patient.  Patient discharged to home/self care.  Condition at discharge: Stable  Note: Portions of this report may have been transcribed using voice recognition software. Every effort was made to ensure accuracy; however, inadvertent computerized transcription errors may be present.   Final Clinical Impression(s) / ED Diagnoses Final diagnoses:  Goiter  Hyperthyroidism  Atypical chest pain   Rx / DC Orders ED Discharge Orders         Ordered    famotidine (PEPCID) 20 MG tablet  2 times daily,   Status:  Discontinued        02/21/20 1525    famotidine (PEPCID) 20 MG tablet  2 times daily        02/21/20 1526    metoprolol tartrate (LOPRESSOR) 50 MG tablet  2 times daily        02/21/20 1541           Placido Sou, PA-C 02/21/20 1543    Wynetta Fines, MD 02/23/20 2030

## 2020-02-21 NOTE — Discharge Instructions (Addendum)
I am going to prescribe you a medication called Pepcid.  Please take this once a day.  This medication will hopefully help with your chest pain symptoms.  I have also refilled your metoprolol.  You are going to take a half a pill twice per day.  Please make sure you go to your appointment in 2 weeks with [facility].  You also need to have them review your CT scan findings today as you will likely need a follow-up "CT PET" scan.  If you develop new or worsening symptoms you need to return to the ER for reevaluation.  Also, please follow-up with your cardiologist regarding your symptoms.  It was a pleasure to meet you.

## 2020-02-21 NOTE — ED Notes (Signed)
Patient transported to X-ray.Transport will bring pt back to room once finished with exam.

## 2020-02-21 NOTE — ED Triage Notes (Addendum)
C/o chest pain with exertion and nausea since waking up this morning.  States she has chest pain everyday and just ran out of her medication yesterday (unsure name of med).  Denies SOB.

## 2020-03-01 ENCOUNTER — Telehealth (HOSPITAL_COMMUNITY): Payer: Self-pay | Admitting: Internal Medicine

## 2020-03-01 NOTE — Telephone Encounter (Signed)
Patient cancelled echocardiogram VIA automated system and I called to verify and left voicemail that appt had been cancelled and to call us to reschedule. Order will be removed from the WQ and when patient calls back we will reinstate the order. Thank you.

## 2020-03-03 ENCOUNTER — Other Ambulatory Visit (HOSPITAL_COMMUNITY): Payer: Medicaid Other

## 2020-03-11 ENCOUNTER — Encounter (INDEPENDENT_AMBULATORY_CARE_PROVIDER_SITE_OTHER): Payer: Self-pay | Admitting: Primary Care

## 2020-03-11 ENCOUNTER — Ambulatory Visit (INDEPENDENT_AMBULATORY_CARE_PROVIDER_SITE_OTHER): Payer: Self-pay | Admitting: Primary Care

## 2020-03-11 ENCOUNTER — Other Ambulatory Visit: Payer: Self-pay

## 2020-03-11 ENCOUNTER — Other Ambulatory Visit (INDEPENDENT_AMBULATORY_CARE_PROVIDER_SITE_OTHER): Payer: Self-pay | Admitting: Primary Care

## 2020-03-11 VITALS — BP 130/63 | HR 74 | Temp 97.5°F | Ht 66.0 in | Wt 209.8 lb

## 2020-03-11 DIAGNOSIS — Z7689 Persons encountering health services in other specified circumstances: Secondary | ICD-10-CM

## 2020-03-11 DIAGNOSIS — I10 Essential (primary) hypertension: Secondary | ICD-10-CM

## 2020-03-11 DIAGNOSIS — Z09 Encounter for follow-up examination after completed treatment for conditions other than malignant neoplasm: Secondary | ICD-10-CM

## 2020-03-11 DIAGNOSIS — Z23 Encounter for immunization: Secondary | ICD-10-CM

## 2020-03-11 DIAGNOSIS — E0590 Thyrotoxicosis, unspecified without thyrotoxic crisis or storm: Secondary | ICD-10-CM

## 2020-03-11 DIAGNOSIS — Z131 Encounter for screening for diabetes mellitus: Secondary | ICD-10-CM

## 2020-03-11 LAB — POCT GLYCOSYLATED HEMOGLOBIN (HGB A1C): Hemoglobin A1C: 5 % (ref 4.0–5.6)

## 2020-03-11 MED ORDER — AMLODIPINE BESYLATE 5 MG PO TABS: 5.0000 mg | ORAL_TABLET | Freq: Every day | ORAL | 1 refills | Status: DC

## 2020-03-11 MED ORDER — METHIMAZOLE 5 MG PO TABS: 5.0000 mg | ORAL_TABLET | Freq: Three times a day (TID) | ORAL | 1 refills | Status: DC

## 2020-03-11 MED ORDER — METOPROLOL TARTRATE 25 MG PO TABS
25.0000 mg | ORAL_TABLET | Freq: Two times a day (BID) | ORAL | 1 refills | Status: AC
  Filled 2020-08-13: qty 60, 30d supply, fill #0
  Filled 2020-09-22: qty 60, 30d supply, fill #1

## 2020-03-11 MED ORDER — METOPROLOL TARTRATE 50 MG PO TABS: 25.0000 mg | ORAL_TABLET | Freq: Two times a day (BID) | ORAL | 1 refills | Status: DC

## 2020-03-11 MED FILL — AMLODIPINE BESYLATE 5 MG TA: 5 | 30 days supply | Qty: 30 | Fill #0

## 2020-03-11 MED FILL — METOPROLOL TARTRATE 25 MG T: 25 | 30 days supply | Qty: 60 | Fill #0

## 2020-03-11 MED FILL — methIMAzole 5 MG TABS: 5 | 30 days supply | Qty: 90 | Fill #0

## 2020-03-11 NOTE — Progress Notes (Signed)
New Patient Office Visit  Subjective:  Patient ID: Courtney Lam, female    DOB: 1971/02/13  Age: 49 y.o. MRN: [id number]  CC:  Chief Complaint  Patient presents with   New Patient (Initial Visit)    thyroid     HPI Ms.Courtney Lam is a 49 year old obese female who presents for establishment of care and management of hyperthyroidism.  Recently in the emergency room on 01/17/2020 for chest pain waited so long she left before being seen.  Patient returned to the emergency room on 02/21/2020 for chest pain and increased with coughing and eating.  Past Medical History:  Diagnosis Date   Chest pain    Hypertension    Hyperthyroidism    SOB (shortness of breath)     Past Surgical History:  Procedure Laterality Date   ECTOPIC PREGNANCY SURGERY      Family History  Problem Relation Age of Onset   Cancer Mother 30   High blood pressure Father    Diabetes Father    Asthma Sister    Thyroid disease Brother    Thyroid disease Sister     Social History   Socioeconomic History   Marital status: Single    Spouse name: Not on file   Number of children: Not on file   Years of education: Not on file   Highest education level: Not on file  Occupational History   Not on file  Tobacco Use   Smoking status: Former Smoker   Smokeless tobacco: Never Used  Substance and Sexual Activity   Alcohol use: Not Currently   Drug use: Not Currently   Sexual activity: Not on file  Other Topics Concern   Not on file  Social History Narrative   Not on file   Social Determinants of Health   Financial Resource Strain:    Difficulty of Paying Living Expenses: Not on file  Food Insecurity:    Worried About Programme researcher, broadcasting/film/video in the Last Year: Not on file   [employer] of Food in the Last Year: Not on file  Transportation Needs:    Lack of Transportation (Medical): Not on file   Lack of Transportation (Non-Medical): Not on file  Physical Activity:    Days of  Exercise per Week: Not on file   Minutes of Exercise per Session: Not on file  Stress:    Feeling of Stress : Not on file  Social Connections:    Frequency of Communication with Friends and Family: Not on file   Frequency of Social Gatherings with Friends and Family: Not on file   Attends Religious Services: Not on file   Active Member of Clubs or Organizations: Not on file   Attends Banker Meetings: Not on file   Marital Status: Not on file  Intimate Partner Violence:    Fear of Current or Ex-Partner: Not on file   Emotionally Abused: Not on file   Physically Abused: Not on file   Sexually Abused: Not on file    ROS Review of Systems  Constitutional: Positive for appetite change.  HENT: Positive for congestion.   Eyes: Positive for visual disturbance.  Respiratory: Positive for shortness of breath.        With exertion   Cardiovascular: Positive for chest pain.  Gastrointestinal: Positive for constipation.  Endocrine: Positive for polydipsia.  Psychiatric/Behavioral: Positive for sleep disturbance.  All other systems reviewed and are negative.   Objective:   Today's Vitals: BP 130/63 (BP Location: Right  Arm, Patient Position: Sitting, Cuff Size: Large)    Pulse 74    Temp (!) 97.5 F (36.4 C) (Temporal)    Ht 5\' 6"  (1.676 m)    Wt 209 lb 12.8 oz (95.2 kg)    LMP 02/25/2020 (Approximate)    SpO2 97%    BMI 33.86 kg/m   Physical Exam Vitals reviewed.  Constitutional:      Appearance: She is obese.  HENT:     Head: Normocephalic.     Right Ear: There is impacted cerumen.     Left Ear: Tympanic membrane normal.     Ears:     Comments: Removed with ear curette     Nose: Nose normal.  Eyes:     Extraocular Movements: Extraocular movements intact.     Pupils: Pupils are equal, round, and reactive to light.  Cardiovascular:     Rate and Rhythm: Normal rate and regular rhythm.     Heart sounds: Murmur heard.   Pulmonary:     Effort: Pulmonary  effort is normal.     Breath sounds: Normal breath sounds.  Abdominal:     General: Bowel sounds are normal. There is distension.     Palpations: Abdomen is soft.  Musculoskeletal:        General: Normal range of motion.     Cervical back: Normal range of motion and neck supple.  Skin:    General: Skin is warm and dry.  Neurological:     Mental Status: She is alert and oriented to person, place, and time.  Psychiatric:        Mood and Affect: Mood normal.        Behavior: Behavior normal.        Thought Content: Thought content normal.        Judgment: Judgment normal.     Assessment & Plan:  Courtney Lam was seen today for new patient (initial visit).  Diagnoses and all orders for this visit:  Screening for diabetes mellitus -     HgB A1c 5.0diabetic  Per ADA guidelines not a pre/  Need for Tdap vaccination -     Tdap vaccine greater than or equal to 7yo IM  Encounter to establish care Darl Pikes, NP-C will be your  (PCP) she is mastered prepared . She is skilled to diagnosed and treat illness. Also able to answer health concern as well as continuing care of varied medical conditions, not limited by cause, organ system, or diagnosis.   Hospital discharge follow-up Recently in the emergency room on 01/17/2020 for chest pain waited so long she left before being seen.  Patient returned to the emergency room on 02/21/2020 for chest pain and increased with coughing and eating.  Hyperthyroidism Discussed tx with supervising physician and advised to place on methimazole 5mg  tid and have her to rtn in 6 weeks.  Informed patient to apply for [facility] financial than she can be referred to endocrinology for tx -     methimazole (TAPAZOLE) 5 MG tablet; Take 1 tablet (5 mg total) by mouth 3 (three) times daily. -     TSH + free T4; Future  Essential hypertension -     amLODipine (NORVASC) 5 MG tablet; Take 1 tablet (5 mg total) by mouth daily. -     Discontinue: metoprolol tartrate  (LOPRESSOR) 50 MG tablet; Take 0.5 tablets (25 mg total) by mouth 2 (two) times daily. -     metoprolol tartrate (LOPRESSOR) 25 MG tablet; Take 1  tablet (25 mg total) by mouth 2 (two) times daily.  Need for immunization against influenza -     Flu Vaccine QUAD 36+ mos IM    Outpatient Encounter Medications as of 03/11/2020  Medication Sig   amLODipine (NORVASC) 5 MG tablet Take 1 tablet (5 mg total) by mouth daily.   metoprolol tartrate (LOPRESSOR) 25 MG tablet Take 1 tablet (25 mg total) by mouth 2 (two) times daily.   [DISCONTINUED] amLODipine (NORVASC) 5 MG tablet Take 5 mg by mouth daily.    [DISCONTINUED] metoprolol tartrate (LOPRESSOR) 50 MG tablet Take 0.5 tablets (25 mg total) by mouth 2 (two) times daily.   [DISCONTINUED] metoprolol tartrate (LOPRESSOR) 50 MG tablet Take 0.5 tablets (25 mg total) by mouth 2 (two) times daily.   acetaminophen (TYLENOL) 500 MG tablet Take 1,000 mg by mouth every 6 (six) hours as needed (for dental pain).   famotidine (PEPCID) 20 MG tablet Take 1 tablet (20 mg total) by mouth 2 (two) times daily. (Patient not taking: Reported on 03/11/2020)   methimazole (TAPAZOLE) 5 MG tablet Take 1 tablet (5 mg total) by mouth 3 (three) times daily.   triamcinolone ointment (KENALOG) 0.1 % Apply 1 application topically See admin instructions. Apply to affected areas daily as needed/as directed   [DISCONTINUED] diphenhydrAMINE HCl, Sleep, (ZZZQUIL) 25 MG CAPS Take 25 mg by mouth at bedtime as needed (for sleep).   [DISCONTINUED] pantoprazole (PROTONIX) 40 MG tablet Take 1 tablet (40 mg total) by mouth daily. (Patient taking differently: Take 40 mg by mouth daily as needed (for REFLUX). )   No facility-administered encounter medications on file as of 03/11/2020.    Follow-up: Return in about 6 weeks (around 04/22/2020) for Labs only .   Grayce Sessions, NP

## 2020-03-11 NOTE — Patient Instructions (Addendum)
https://www.cdc.gov/vaccines/hcp/vis/vis-statements/tdap.pdf">  Tdap (Tetanus, Diphtheria, Pertussis) Vaccine: What You Need to Know 1. Why get vaccinated? Tdap vaccine can prevent tetanus, diphtheria, and pertussis. Diphtheria and pertussis spread from person to person. Tetanus enters the body through cuts or wounds.  TETANUS (T) causes painful stiffening of the muscles. Tetanus can lead to serious health problems, including being unable to open the mouth, having trouble swallowing and breathing, or death.  DIPHTHERIA (D) can lead to difficulty breathing, heart failure, paralysis, or death.  PERTUSSIS (aP), also known as "whooping cough," can cause uncontrollable, violent coughing which makes it hard to breathe, eat, or drink. Pertussis can be extremely serious in babies and young children, causing pneumonia, convulsions, brain damage, or death. In teens and adults, it can cause weight loss, loss of bladder control, passing out, and rib fractures from severe coughing. 2. Tdap vaccine Tdap is only for children 7 years and older, adolescents, and adults.  Adolescents should receive a single dose of Tdap, preferably at age 53 or 35 years. Pregnant women should get a dose of Tdap during every pregnancy, to protect the newborn from pertussis. Infants are most at risk for severe, life-threatening complications from pertussis. Adults who have never received Tdap should get a dose of Tdap. Also, adults should receive a booster dose every 10 years, or earlier in the case of a severe and dirty wound or burn. Booster doses can be either Tdap or Td (a different vaccine that protects against tetanus and diphtheria but not pertussis). Tdap may be given at the same time as other vaccines. 3. Talk with your health care provider Tell your vaccine provider if the person getting the vaccine:  Has had an allergic reaction after a previous dose of any vaccine that protects against tetanus, diphtheria, or pertussis,  or has any severe, life-threatening allergies.  Has had a coma, decreased level of consciousness, or prolonged seizures within 7 days after a previous dose of any pertussis vaccine (DTP, DTaP, or Tdap).  Has seizures or another nervous system problem.  Has ever had Guillain-Barr Syndrome (also called GBS).  Has had severe pain or swelling after a previous dose of any vaccine that protects against tetanus or diphtheria. In some cases, your health care provider may decide to postpone Tdap vaccination to a future visit.  People with minor illnesses, such as a cold, may be vaccinated. People who are moderately or severely ill should usually wait until they recover before getting Tdap vaccine.  Your health care provider can give you more information. 4. Risks of a vaccine reaction  Pain, redness, or swelling where the shot was given, mild fever, headache, feeling tired, and nausea, vomiting, diarrhea, or stomachache sometimes happen after Tdap vaccine. People sometimes faint after medical procedures, including vaccination. Tell your provider if you feel dizzy or have vision changes or ringing in the ears.  As with any medicine, there is a very remote chance of a vaccine causing a severe allergic reaction, other serious injury, or death. 5. What if there is a serious problem? An allergic reaction could occur after the vaccinated person leaves the clinic. If you see signs of a severe allergic reaction (hives, swelling of the face and throat, difficulty breathing, a fast heartbeat, dizziness, or weakness), call 9-1-1 and get the person to the nearest hospital. For other signs that concern you, call your health care provider.  Adverse reactions should be reported to the Vaccine Adverse Event Reporting System (VAERS). Your health care provider will usually file this report,  or you can do it yourself. Visit the VAERS website at www.vaers.SamedayNews.es or call (816)266-5606. VAERS is only for reporting  reactions, and VAERS staff do not give medical advice. 6. The National Vaccine Injury Compensation Program The [employer] Vaccine Injury Compensation Program (VICP) is a federal program that was created to compensate people who may have been injured by certain vaccines. Visit the VICP website at GoldCloset.com.ee or call 847-088-0378 to learn about the program and about filing a claim. There is a time limit to file a claim for compensation. 7. How can I learn more?  Ask your health care provider.  Call your local or state health department.  Contact the Centers for Disease Control and Prevention (CDC): ? Call 224-436-2351 (1-800-CDC-INFO) or ? Visit CDC's website at http://hunter.com/ Vaccine Information Statement Tdap (Tetanus, Diphtheria, Pertussis) Vaccine (07/24/2018) This information is not intended to replace advice given to you by your health care provider. Make sure you discuss any questions you have with your health care provider. Document Revised: 08/02/2018 Document Reviewed: 08/05/2018 Elsevier Patient Education  2020 [employer]. Influenza, Adult Influenza is also called "the flu." It is an infection in the lungs, nose, and throat (respiratory tract). It is caused by a virus. The flu causes symptoms that are similar to symptoms of a cold. It also causes a high fever and body aches. The flu spreads easily from person to person (is contagious). Getting a flu shot (influenza vaccination) every year is the best way to prevent the flu. What are the causes? This condition is caused by the influenza virus. You can get the virus by:  Breathing in droplets that are in the air from the cough or sneeze of a person who has the virus.  Touching something that has the virus on it (is contaminated) and then touching your mouth, nose, or eyes. What increases the risk? Certain things may make you more likely to get the flu. These include:  Not washing your hands  often.  Having close contact with many people during cold and flu season.  Touching your mouth, eyes, or nose without first washing your hands.  Not getting a flu shot every year. You may have a higher risk for the flu, along with serious problems such as a lung infection (pneumonia), if you:  Are older than 65.  Are pregnant.  Have a weakened disease-fighting system (immune system) because of a disease or taking certain medicines.  Have a long-term (chronic) illness, such as: ? Heart, kidney, or lung disease. ? Diabetes. ? Asthma.  Have a liver disorder.  Are very overweight (morbidly obese).  Have anemia. This is a condition that affects your red blood cells. What are the signs or symptoms? Symptoms usually begin suddenly and last 4-14 days. They may include:  Fever and chills.  Headaches, body aches, or muscle aches.  Sore throat.  Cough.  Runny or stuffy (congested) nose.  Chest discomfort.  Not wanting to eat as much as normal (poor appetite).  Weakness or feeling tired (fatigue).  Dizziness.  Feeling sick to your stomach (nauseous) or throwing up (vomiting). How is this treated? If the flu is found early, you can be treated with medicine that can help reduce how bad the illness is and how long it lasts (antiviral medicine). This may be given by mouth (orally) or through an IV tube. Taking care of yourself at home can help your symptoms get better. Your doctor may suggest:  Taking over-the-counter medicines.  Drinking plenty of fluids. The  flu often goes away on its own. If you have very bad symptoms or other problems, you may be treated in a hospital. Follow these instructions at home:     Activity  Rest as needed. Get plenty of sleep.  Stay home from work or school as told by your doctor. ? Do not leave home until you do not have a fever for 24 hours without taking medicine. ? Leave home only to visit your doctor. Eating and drinking  Take an  ORS (oral rehydration solution). This is a drink that is sold at pharmacies and stores.  Drink enough fluid to keep your pee (urine) pale yellow.  Drink clear fluids in small amounts as you are able. Clear fluids include: ? Water. ? Ice chips. ? Fruit juice that has water added (diluted fruit juice). ? Low-calorie sports drinks.  Eat bland, easy-to-digest foods in small amounts as you are able. These foods include: ? Bananas. ? Applesauce. ? Rice. ? Lean meats. ? Toast. ? Crackers.  Do not eat or drink: ? Fluids that have a lot of sugar or caffeine. ? Alcohol. ? Spicy or fatty foods. General instructions  Take over-the-counter and prescription medicines only as told by your doctor.  Use a cool mist humidifier to add moisture to the air in your home. This can make it easier for you to breathe.  Cover your mouth and nose when you cough or sneeze.  Wash your hands with soap and water often, especially after you cough or sneeze. If you cannot use soap and water, use alcohol-based hand sanitizer.  Keep all follow-up visits as told by your doctor. This is important. How is this prevented?   Get a flu shot every year. You may get the flu shot in late summer, fall, or winter. Ask your doctor when you should get your flu shot.  Avoid contact with people who are sick during fall and winter (cold and flu season). Contact a doctor if:  You get new symptoms.  You have: ? Chest pain. ? Watery poop (diarrhea). ? A fever.  Your cough gets worse.  You start to have more mucus.  You feel sick to your stomach.  You throw up. Get help right away if you:  Have shortness of breath.  Have trouble breathing.  Have skin or nails that turn a bluish color.  Have very bad pain or stiffness in your neck.  Get a sudden headache.  Get sudden pain in your face or ear.  Cannot eat or drink without throwing up. Summary  Influenza ("the flu") is an infection in the lungs, nose,  and throat. It is caused by a virus.  Take over-the-counter and prescription medicines only as told by your doctor.  Getting a flu shot every year is the best way to avoid getting the flu. This information is not intended to replace advice given to you by your health care provider. Make sure you discuss any questions you have with your health care provider. Document Revised: 09/26/2017 Document Reviewed: 09/26/2017 Elsevier Patient Education  2020 [employer].  Hyperthyroidism  Hyperthyroidism is when the thyroid gland is too active (overactive). The thyroid gland is a small gland located in the lower front part of the neck, just in front of the windpipe (trachea). This gland makes hormones that help control how the body uses food for energy (metabolism) as well as how the heart and brain function. These hormones also play a role in keeping your bones strong. When the  thyroid is overactive, it produces too much of a hormone called thyroxine. What are the causes? This condition may be caused by:  Graves' disease. This is a disorder in which the body's disease-fighting system (immune system) attacks the thyroid gland. This is the most common cause.  Inflammation of the thyroid gland.  A tumor in the thyroid gland.  Use of certain medicines, including: ? Prescription thyroid hormone replacement. ? Herbal supplements that mimic thyroid hormones. ? Amiodarone therapy.  Solid or fluid-filled lumps within your thyroid gland (thyroid nodules).  Taking in a large amount of iodine from foods or medicines. What increases the risk? You are more likely to develop this condition if:  You are female.  You have a family history of thyroid conditions.  You smoke tobacco.  You use a medicine called lithium.  You take medicines that affect the immune system (immunosuppressants). What are the signs or symptoms? Symptoms of this condition include:  Nervousness.  Inability to tolerate  heat.  Unexplained weight loss.  Diarrhea.  Change in the texture of hair or skin.  Heart skipping beats or making extra beats.  Rapid heart rate.  Loss of menstruation.  Shaky hands.  Fatigue.  Restlessness.  Sleep problems.  Enlarged thyroid gland or a lump in the thyroid (nodule). You may also have symptoms of Graves' disease, which may include:  Protruding eyes.  Dry eyes.  Red or swollen eyes.  Problems with vision. How is this diagnosed? This condition may be diagnosed based on:  Your symptoms and medical history.  A physical exam.  Blood tests.  Thyroid ultrasound. This test involves using sound waves to produce images of the thyroid gland.  A thyroid scan. A radioactive substance is injected into a vein, and images show how much iodine is present in the thyroid.  Radioactive iodine uptake test (RAIU). A small amount of radioactive iodine is given by mouth to see how much iodine the thyroid absorbs after a certain amount of time. How is this treated? Treatment depends on the cause and severity of the condition. Treatment may include:  Medicines to reduce the amount of thyroid hormone your body makes.  Radioactive iodine treatment (radioiodine therapy). This involves swallowing a small dose of radioactive iodine, in capsule or liquid form, to kill thyroid cells.  Surgery to remove part or all of your thyroid gland. You may need to take thyroid hormone replacement medicine for the rest of your life after thyroid surgery.  Medicines to help manage your symptoms. Follow these instructions at home:   Take over-the-counter and prescription medicines only as told by your health care provider.  Do not use any products that contain nicotine or tobacco, such as cigarettes and e-cigarettes. If you need help quitting, ask your health care provider.  Follow any instructions from your health care provider about diet. You may be instructed to limit foods that  contain iodine.  Keep all follow-up visits as told by your health care provider. This is important. ? You will need to have blood tests regularly so that your health care provider can monitor your condition. Contact a health care provider if:  Your symptoms do not get better with treatment.  You have a fever.  You are taking thyroid hormone replacement medicine and you: ? Have symptoms of depression. ? Feel like you are tired all the time. ? Gain weight. Get help right away if:  You have chest pain.  You have decreased alertness or a change in your awareness.  You have abdominal pain.  You feel dizzy.  You have a rapid heartbeat.  You have an irregular heartbeat.  You have difficulty breathing. Summary  The thyroid gland is a small gland located in the lower front part of the neck, just in front of the windpipe (trachea).  Hyperthyroidism is when the thyroid gland is too active (overactive) and produces too much of a hormone called thyroxine.  The most common cause is Graves' disease, a disorder in which your immune system attacks the thyroid gland.  Hyperthyroidism can cause various symptoms, such as unexplained weight loss, nervousness, inability to tolerate heat, or changes in your heartbeat.  Treatment may include medicine to reduce the amount of thyroid hormone your body makes, radioiodine therapy, surgery, or medicines to manage symptoms. This information is not intended to replace advice given to you by your health care provider. Make sure you discuss any questions you have with your health care provider. Document Revised: 03/23/2017 Document Reviewed: 03/21/2017 Elsevier Patient Education  2020 [employer].

## 2020-03-23 ENCOUNTER — Ambulatory Visit: Payer: Medicaid Other | Admitting: Nurse Practitioner

## 2020-03-31 ENCOUNTER — Ambulatory Visit: Payer: Self-pay | Attending: Primary Care

## 2020-03-31 ENCOUNTER — Other Ambulatory Visit: Payer: Self-pay

## 2020-04-06 ENCOUNTER — Ambulatory Visit: Payer: Self-pay | Admitting: Internal Medicine

## 2020-04-07 ENCOUNTER — Telehealth: Payer: Self-pay | Admitting: Primary Care

## 2020-04-07 NOTE — Telephone Encounter (Signed)
Copied from CRM [id number]. Topic: General - Inquiry >> Apr 06, 2020  2:06 PM Leafy Ro wrote: Reason for CRM: Pt would like carlos to return her call concerning unable to obtain some of financial paperwork  the is need for application

## 2020-04-07 NOTE — Telephone Encounter (Signed)
I return Pt call, she inform that she got the papers she missing to complete fer application

## 2020-04-20 ENCOUNTER — Telehealth: Payer: Self-pay | Admitting: Primary Care

## 2020-04-20 NOTE — Telephone Encounter (Signed)
I called the Pt since I received an email from [facility] financial, LVM inform the Pt that we need a new letter of support with the name, address, phone number and time of the person who is helping her financial

## 2020-04-21 ENCOUNTER — Other Ambulatory Visit: Payer: Self-pay

## 2020-04-21 ENCOUNTER — Other Ambulatory Visit (INDEPENDENT_AMBULATORY_CARE_PROVIDER_SITE_OTHER): Payer: Self-pay

## 2020-04-21 DIAGNOSIS — E0590 Thyrotoxicosis, unspecified without thyrotoxic crisis or storm: Secondary | ICD-10-CM

## 2020-04-22 LAB — TSH+FREE T4
Free T4: 2.7 ng/dL — ABNORMAL HIGH (ref 0.82–1.77)
TSH: <0.005 u[IU]/mL — ABNORMAL LOW (ref 0.450–4.500)

## 2020-04-22 MED FILL — METHIMAZOLE 5 MG TABS: 5 | 30 days supply | Qty: 90 | Fill #1

## 2020-04-22 MED FILL — METOPROLOL TARTRATE 25 MG T: 25 | 30 days supply | Qty: 60 | Fill #1

## 2020-04-22 MED FILL — AMLODIPINE BESYLATE 5 MG TA: 5 | 30 days supply | Qty: 30 | Fill #1

## 2020-05-04 ENCOUNTER — Telehealth: Payer: Self-pay | Admitting: Primary Care

## 2020-05-04 NOTE — Telephone Encounter (Signed)
I call the Pt again today, LVM again inform her that [phone] I was holding this account for the patient to provide a corrected letter of support, it's been 14 days is the patient going to provide a corrected letter? If not I will do ahead and deny their application., also in the VM I told the Pt to call me call today since we need the letter by the end of the day

## 2020-05-10 ENCOUNTER — Other Ambulatory Visit (INDEPENDENT_AMBULATORY_CARE_PROVIDER_SITE_OTHER): Payer: Self-pay | Admitting: Primary Care

## 2020-05-10 DIAGNOSIS — E0590 Thyrotoxicosis, unspecified without thyrotoxic crisis or storm: Secondary | ICD-10-CM

## 2020-05-10 MED ORDER — METHIMAZOLE 10 MG PO TABS: 10.0000 mg | ORAL_TABLET | Freq: Three times a day (TID) | ORAL | 1 refills | Status: DC

## 2020-05-11 ENCOUNTER — Telehealth (INDEPENDENT_AMBULATORY_CARE_PROVIDER_SITE_OTHER): Payer: Self-pay

## 2020-05-11 NOTE — Telephone Encounter (Signed)
-----   Message from Grayce Sessions, NP sent at 05/10/2020  3:57 PM EST ----- Increase Tapazole to 10mg  tid and if you are unable to manage please refer to Endocrine per Dr. referral sent

## 2020-05-11 NOTE — Telephone Encounter (Signed)
Per DPR left detailed voicemail notifying patient that Tapazole has been increased to 10mg  three times daily and referral placed to specialist to manage thyroid. Return call to [phone] with any questions or concerns. [phone], CMA

## 2020-05-27 ENCOUNTER — Encounter: Payer: Self-pay | Admitting: Internal Medicine

## 2020-06-15 MED FILL — AMLODIPINE BESYLATE 5 MG TA: 5 | 30 days supply | Qty: 30 | Fill #2

## 2020-06-15 MED FILL — ?METOPROLOL TARTRATE 25MG T: 25 | 30 days supply | Qty: 60 | Fill #2

## 2020-06-23 ENCOUNTER — Encounter (INDEPENDENT_AMBULATORY_CARE_PROVIDER_SITE_OTHER): Payer: Self-pay | Admitting: Primary Care

## 2020-06-23 ENCOUNTER — Ambulatory Visit (INDEPENDENT_AMBULATORY_CARE_PROVIDER_SITE_OTHER): Payer: Self-pay | Admitting: Primary Care

## 2020-06-23 ENCOUNTER — Other Ambulatory Visit (INDEPENDENT_AMBULATORY_CARE_PROVIDER_SITE_OTHER): Payer: Self-pay | Admitting: Primary Care

## 2020-06-23 ENCOUNTER — Other Ambulatory Visit: Payer: Self-pay

## 2020-06-23 VITALS — BP 124/78 | HR 82 | Temp 97.3°F | Wt 206.8 lb

## 2020-06-23 DIAGNOSIS — Z760 Encounter for issue of repeat prescription: Secondary | ICD-10-CM

## 2020-06-23 DIAGNOSIS — E0590 Thyrotoxicosis, unspecified without thyrotoxic crisis or storm: Secondary | ICD-10-CM

## 2020-06-23 MED ORDER — METHIMAZOLE 10 MG PO TABS: 10.0000 mg | ORAL_TABLET | Freq: Three times a day (TID) | ORAL | 1 refills | Status: DC

## 2020-06-23 MED FILL — methIMAzole 10 MG TABS: 10 | 30 days supply | Qty: 90 | Fill #0

## 2020-06-23 NOTE — Progress Notes (Signed)
Pt has taken thyroid medication in about three weeks she was unaware that she had refills

## 2020-06-23 NOTE — Patient Instructions (Signed)

## 2020-06-23 NOTE — Progress Notes (Signed)
Subjective:     Ms. Nastacia Raybuck is a 50 y.o. obese  female who has  hyperthyroidism. On previous visit she was not aware tapazole was increased to 10 mg by mouth 3 (three) times daily and she has been continuing her tapazole 5 mg by mouth 3 (three) times daily . Until she ran completely out of it 3 weeks ago  Symptoms include tachycardia, irritability, weight loss, palpitations, exophthalmos.  Symptoms have been present for 3 week. The patient denies drug abuse, amphetamine use, diet pills, episodic hypertension, tender neck / sore throat and vision changes The patient reports use of thyroid medicines. Until she ran out. Prior studies include TSH.  Family history includes hypothyroidism.  The following portions of the patient's history were reviewed and updated as appropriate: problem list.  Review of Systems Pertinent items are noted in HPI.     Objective:    BP 124/78 (BP Location: Right Arm, Patient Position: Sitting, Cuff Size: Normal)   Pulse 82   Temp (!) 97.3 F (36.3 C) (Temporal)   Wt 206 lb 12.8 oz (93.8 kg)   LMP 06/16/2020 (Approximate)   BMI 33.38 kg/m   General:  alert, cooperative, appears stated age and no distress      Eyes:  conjunctivae/corneas clear. PERRL, EOM's intact. Fundi benign.   Ears:  normal TM's and external ear canals both ears  Neck: no adenopathy, no carotid bruit, no JVD, supple, symmetrical, trachea midline and thyroid not enlarged, symmetric, no tenderness/mass/nodules  Thyroid:  no palpable nodule  Lung: clear to auscultation bilaterally  Heart:  regular rate and rhythm, S1, S2 normal, no murmur, click, rub or gallop  Abdomen: soft, non-tender; bowel sounds normal; no masses,  no organomegaly  Extremities: extremities normal, atraumatic, no cyanosis or edema  Pulses: 2+ and symmetric  Skin: warm and dry, no hyperpigmentation, vitiligo, or suspicious lesions  Neuro: normal without focal findings, mental status, speech normal, alert and oriented x3,  PERLA and reflexes normal and symmetric   Lab Review Lab Results  Component Value Date   TSH <0.005 (L) 04/21/2020   Lab Results  Component Value Date   FREET4 2.70 (H) 04/21/2020      Assessment:    Hyperthyroidism. This diagnosis was discussed and reviewed with the patient including the advantages of drug therapy,    1. Medications:  Tapazole 2. Has endocrinology appointment May 2022 3. Follow up: 6 weeks and as needed.

## 2020-06-24 LAB — TSH+FREE T4
Free T4: >7.77 ng/dL — ABNORMAL HIGH (ref 0.82–1.77)
TSH: <0.005 u[IU]/mL — ABNORMAL LOW (ref 0.450–4.500)

## 2020-07-07 ENCOUNTER — Ambulatory Visit (INDEPENDENT_AMBULATORY_CARE_PROVIDER_SITE_OTHER): Payer: Self-pay | Admitting: *Deleted

## 2020-07-07 NOTE — Telephone Encounter (Signed)
Since Sunday- loose stool with nausea. Patient is using Pepto and increased fluids with Pedialyte but symptoms have continued. No appointment available on schedule- advised UC within 24 hour period if not better with home care. Note sent for PCP review.  Reason for Disposition . [1] SEVERE diarrhea (e.g., 7 or more times / day more than normal) AND [2] present > 24 hours (1 day)  Answer Assessment - Initial Assessment Questions 1. DIARRHEA SEVERITY: "How bad is the diarrhea?" "How many extra stools have you had in the past 24 hours than normal?"    - NO DIARRHEA (SCALE 0)   - MILD (SCALE 1-3): Few loose or mushy BMs; increase of 1-3 stools over normal daily number of stools; mild increase in ostomy output.   -  MODERATE (SCALE 4-7): Increase of 4-6 stools daily over normal; moderate increase in ostomy output. * SEVERE (SCALE 8-10; OR 'WORST POSSIBLE'): Increase of 7 or more stools daily over normal; moderate increase in ostomy output; incontinence.     severe 2. ONSET: "When did the diarrhea begin?"      Sunday 3. BM CONSISTENCY: "How loose or watery is the diarrhea?"      watery 4. VOMITING: "Are you also vomiting?" If Yes, ask: "How many times in the past 24 hours?"      Nausea- no vomiting 5. ABDOMINAL PAIN: "Are you having any abdominal pain?" If Yes, ask: "What does it feel like?" (e.g., crampy, dull, intermittent, constant)      No- discomfort 6. ABDOMINAL PAIN SEVERITY: If present, ask: "How bad is the pain?"  (e.g., Scale 1-10; mild, moderate, or severe)   - MILD (1-3): doesn't interfere with normal activities, abdomen soft and not tender to touch    - MODERATE (4-7): interferes with normal activities or awakens from sleep, tender to touch    - SEVERE (8-10): excruciating pain, doubled over, unable to do any normal activities       mild 7. ORAL INTAKE: If vomiting, "Have you been able to drink liquids?" "How much fluids have you had in the past 24 hours?"     n/a 8. HYDRATION: "Any  signs of dehydration?" (e.g., dry mouth [not just dry lips], too weak to stand, dizziness, new weight loss) "When did you last urinate?"     All normal 9. EXPOSURE: "Have you traveled to a foreign country recently?" "Have you been exposed to anyone with diarrhea?" "Could you have eaten any food that was spoiled?"     no 10. ANTIBIOTIC USE: "Are you taking antibiotics now or have you taken antibiotics in the past 2 months?"       no 11. OTHER SYMPTOMS: "Do you have any other symptoms?" (e.g., fever, blood in stool)       Dark stool- black stool a few times yesterday- pepto 12. PREGNANCY: "Is there any chance you are pregnant?" "When was your last menstrual period?"       n/a  Protocols used: DIARRHEA-A-AH

## 2020-07-29 ENCOUNTER — Other Ambulatory Visit: Payer: Self-pay

## 2020-07-29 MED FILL — Amlodipine Besylate Tab 5 MG (Base Equivalent): ORAL | 30 days supply | Qty: 30 | Fill #0 | Status: CN

## 2020-07-29 MED FILL — Methimazole Tab 10 MG: ORAL | 30 days supply | Qty: 90 | Fill #0 | Status: CN

## 2020-08-04 ENCOUNTER — Other Ambulatory Visit (INDEPENDENT_AMBULATORY_CARE_PROVIDER_SITE_OTHER): Payer: Medicaid Other

## 2020-08-04 ENCOUNTER — Other Ambulatory Visit: Payer: Self-pay

## 2020-08-04 DIAGNOSIS — E0590 Thyrotoxicosis, unspecified without thyrotoxic crisis or storm: Secondary | ICD-10-CM

## 2020-08-05 LAB — TSH+FREE T4
Free T4: 1 ng/dL (ref 0.82–1.77)
TSH: <0.005 u[IU]/mL — ABNORMAL LOW (ref 0.450–4.500)

## 2020-08-09 ENCOUNTER — Other Ambulatory Visit: Payer: Self-pay

## 2020-08-11 ENCOUNTER — Other Ambulatory Visit: Payer: Self-pay

## 2020-08-13 ENCOUNTER — Other Ambulatory Visit: Payer: Self-pay

## 2020-08-13 MED FILL — Amlodipine Besylate Tab 5 MG (Base Equivalent): ORAL | 30 days supply | Qty: 30 | Fill #0 | Status: AC

## 2020-08-13 MED FILL — Methimazole Tab 10 MG: ORAL | 30 days supply | Qty: 90 | Fill #0 | Status: AC

## 2020-08-23 ENCOUNTER — Other Ambulatory Visit: Payer: Self-pay

## 2020-08-23 ENCOUNTER — Ambulatory Visit (INDEPENDENT_AMBULATORY_CARE_PROVIDER_SITE_OTHER): Payer: Medicaid Other | Admitting: Endocrinology

## 2020-08-23 VITALS — BP 140/70 | HR 64 | Ht 66.0 in | Wt 211.4 lb

## 2020-08-23 DIAGNOSIS — E0590 Thyrotoxicosis, unspecified without thyrotoxic crisis or storm: Secondary | ICD-10-CM

## 2020-08-23 LAB — TSH: TSH: <0.01 u[IU]/mL — ABNORMAL LOW (ref 0.35–4.50)

## 2020-08-23 LAB — T4, FREE: Free T4: 0.66 ng/dL (ref 0.60–1.60)

## 2020-08-23 NOTE — Progress Notes (Signed)
Subjective:    Patient ID: Courtney Lam, female    DOB: 1971-01-22, 50 y.o.   MRN: [id number]  HPI Pt is referred by Gwinda Passe, NP, for hyperthyroidism.  Pt reports he was dx'ed with hyperthyroidism in 2017.  She was rx'ed tapazole+metoprolol.  She has been off and on it, but most recently, she has taken consistently x 5 months.  She was out x 2 weeks, but she resumed last week.  she has never had XRT to the anterior neck, or thyroid surgery.  she does not consume kelp or any other non-prescribed thyroid medication.  she has never been on amiodarone.  She reports doe, heat intolerance, and palpitations.   Past Medical History:  Diagnosis Date  . Chest pain   . Hypertension   . Hyperthyroidism   . SOB (shortness of breath)     Past Surgical History:  Procedure Laterality Date  . ECTOPIC PREGNANCY SURGERY      Social History   Socioeconomic History  . Marital status: Single    Spouse name: Not on file  . Number of children: Not on file  . Years of education: Not on file  . Highest education level: Not on file  Occupational History  . Not on file  Tobacco Use  . Smoking status: Former Games developer  . Smokeless tobacco: Never Used  Substance and Sexual Activity  . Alcohol use: Not Currently  . Drug use: Not Currently  . Sexual activity: Not on file  Other Topics Concern  . Not on file  Social History Narrative  . Not on file   Social Determinants of Health   Financial Resource Strain: Not on file  Food Insecurity: Not on file  Transportation Needs: Not on file  Physical Activity: Not on file  Stress: Not on file  Social Connections: Not on file  Intimate Partner Violence: Not on file    Current Outpatient Medications on File Prior to Visit  Medication Sig Dispense Refill  . acetaminophen (TYLENOL) 500 MG tablet Take 1,000 mg by mouth every 6 (six) hours as needed (for dental pain).    Marland Kitchen amLODipine (NORVASC) 5 MG tablet TAKE 1 TABLET (5 MG TOTAL) BY MOUTH DAILY. 90  tablet 1  . methimazole (TAPAZOLE) 10 MG tablet TAKE 1 TABLET (10 MG TOTAL) BY MOUTH 3 (THREE) TIMES DAILY. 90 tablet 1  . metoprolol tartrate (LOPRESSOR) 25 MG tablet Take 1 tablet (25 mg total) by mouth 2 (two) times daily. 180 tablet 1  . triamcinolone ointment (KENALOG) 0.1 % Apply 1 application topically See admin instructions. Apply to affected areas daily as needed/as directed     No current facility-administered medications on file prior to visit.    No Known Allergies  Family History  Problem Relation Age of Onset  . Cancer Mother 57  . High blood pressure Father   . Diabetes Father   . Asthma Sister   . Thyroid disease Brother   . Thyroid disease Sister     BP 140/70 (BP Location: Right Arm, Patient Position: Sitting, Cuff Size: Normal)   Pulse 64   Ht 5\' 6"  (1.676 m)   Wt 211 lb 6.4 oz (95.9 kg)   SpO2 97%   BMI 34.12 kg/m     Review of Systems Denies recent sxs of weight loss, excessive diaphoresis, tremor, and anxiety.       Objective:   Physical Exam VS: see vs page GEN: no distress HEAD: head: no deformity eyes: no periorbital swelling; moderate  bilat proptosis.   external nose and ears are normal.   NECK: thyroid is 10 x normal size, with minimally irreg surface.   CHEST WALL: no deformity LUNGS: clear to auscultation CV: reg rate and rhythm, no murmur.  MUSCULOSKELETAL: gait is normal and steady.  EXTEMITIES: no deformity.  trace bilat leg edema.   NEURO:  readily moves all 4's.  sensation is intact to touch on all 4's.  No tremor.  SKIN:  Normal texture and temperature.  No rash or suspicious lesion is visible.  Not diaphoretic NODES:  None palpable at the neck PSYCH: alert, well-oriented.  Does not appear anxious nor depressed.    Lab Results  Component Value Date   TSH <0.005 (L) 08/04/2020   [place] (2021): Markedly heterogeneous and enlarged thyroid gland without evidence for distinct thyroid nodule  I have reviewed outside records, and  summarized: Pt was noted to have low TSH, and referred here.  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 Noncompliance with medication: when she is clinically improved, she should consider RAI rx.  Patient Instructions  Blood tests are requested for you today.  We'll let you know about the results.  It is best to never miss the methimazole.  However, if you do miss it, next best is to double up the next time.  However, if you miss the metoprolol, you should just resume it--don't take any extra. If ever you have fever while taking methimazole, stop it and call us, even if the reason is obvious, because of the risk of a rare side-effect. You should consider having a Radioactive iodine treatment pill, as an alternative to the methimazole.  It works like this: We would first check a thyroid "scan" (a special, but easy and painless type of thyroid x ray).  It works like this: you go to the x-ray department of the hospital to swallow a pill, which contains a miniscule amount of radiation.  You will not notice any symptoms from this.  You will go back to the x-ray department the next day, to lie down in front of a camera.  The results of this will be sent to me.   Based on the results, I would hope to then order for you a treatment pill of radioactive iodine.  Although it is a larger amount of radiation, you will again notice no symptoms from this.  The pill is gone from your body in a few days (during which you should stay away from other people), but takes several months to work.  Therefore, please return here approximately 6-8 weeks after the treatment.  This treatment has been available for many years, and the only known side-effect is an underactive thyroid.  It is possible that i would eventually prescribe for you a thyroid hormone pill, which is very inexpensive.  You don't have to worry about side-effects of  this thyroid hormone pill, because it is the same molecule your thyroid makes.   Please come back for a follow-up appointment in 2 months.

## 2020-08-23 NOTE — Patient Instructions (Addendum)
Blood tests are requested for you today.  We'll let you know about the results.  It is best to never miss the methimazole.  However, if you do miss it, next best is to double up the next time.  However, if you miss the metoprolol, you should just resume it--don't take any extra. If ever you have fever while taking methimazole, stop it and call us, even if the reason is obvious, because of the risk of a rare side-effect. You should consider having a Radioactive iodine treatment pill, as an alternative to the methimazole.  It works like this: We would first check a thyroid "scan" (a special, but easy and painless type of thyroid x ray).  It works like this: you go to the x-ray department of the hospital to swallow a pill, which contains a miniscule amount of radiation.  You will not notice any symptoms from this.  You will go back to the x-ray department the next day, to lie down in front of a camera.  The results of this will be sent to me.   Based on the results, I would hope to then order for you a treatment pill of radioactive iodine.  Although it is a larger amount of radiation, you will again notice no symptoms from this.  The pill is gone from your body in a few days (during which you should stay away from other people), but takes several months to work.  Therefore, please return here approximately 6-8 weeks after the treatment.  This treatment has been available for many years, and the only known side-effect is an underactive thyroid.  It is possible that i would eventually prescribe for you a thyroid hormone pill, which is very inexpensive.  You don't have to worry about side-effects of this thyroid hormone pill, because it is the same molecule your thyroid makes.   Please come back for a follow-up appointment in 2 months.

## 2020-08-25 ENCOUNTER — Telehealth: Payer: Self-pay | Admitting: Endocrinology

## 2020-08-25 DIAGNOSIS — E0590 Thyrotoxicosis, unspecified without thyrotoxic crisis or storm: Secondary | ICD-10-CM

## 2020-08-25 NOTE — Telephone Encounter (Signed)
Patients states that Dr Everardo All gave her some options for treatment.  She believes she is going to choose surgery but just has a couple of questions first.  Please call patient a [phone]

## 2020-08-25 NOTE — Telephone Encounter (Signed)
outpt vs 1 night in the hospital is a ? For the surgeon.  I am happy to refer if you want.

## 2020-08-26 NOTE — Telephone Encounter (Signed)
Ok, I placed referral 

## 2020-09-07 ENCOUNTER — Telehealth: Payer: Self-pay | Admitting: Primary Care

## 2020-09-07 NOTE — Telephone Encounter (Signed)
Returning Patient call in reference to financial appt. Patient did not answer a voicemail was left for patient to call [phone] to get scheduled

## 2020-09-08 ENCOUNTER — Telehealth: Payer: Self-pay | Admitting: Primary Care

## 2020-09-08 NOTE — Telephone Encounter (Signed)
Pt returning call to financial advisor. Please advise.

## 2020-09-08 NOTE — Telephone Encounter (Signed)
Copied from CRM [id number]. Topic: Appointment Scheduling - Scheduling Inquiry for Clinic >> Sep 07, 2020  1:00 PM Randol Kern wrote: Reason for CRM: Pt wants to schedule orange card renewal appt  Best contact: [phone]  Called patient, no answer, and LVMx2. Calling to advise patient her financial assistance does not expire until June 8th and she cannot reapply until after the 8th. [facility] schedule is only open for a week at a time, therefore in order to schedule this appointment, please advise patient that she will need to call back on Monday June 6th to schedule an appointment.

## 2020-09-08 NOTE — Telephone Encounter (Signed)
I return Pt call, LVM inform her that her CAFA has a dined  application until 09/29/20, can apply after 09/30/20 for any future bills

## 2020-09-22 ENCOUNTER — Other Ambulatory Visit (INDEPENDENT_AMBULATORY_CARE_PROVIDER_SITE_OTHER): Payer: Self-pay | Admitting: Primary Care

## 2020-09-22 ENCOUNTER — Other Ambulatory Visit: Payer: Self-pay

## 2020-09-22 DIAGNOSIS — Z760 Encounter for issue of repeat prescription: Secondary | ICD-10-CM

## 2020-09-22 DIAGNOSIS — E0590 Thyrotoxicosis, unspecified without thyrotoxic crisis or storm: Secondary | ICD-10-CM

## 2020-09-22 MED FILL — Amlodipine Besylate Tab 5 MG (Base Equivalent): ORAL | 30 days supply | Qty: 30 | Fill #1 | Status: AC

## 2020-09-22 NOTE — Telephone Encounter (Signed)
Requested medication (s) are due for refill today: yes  Requested medication (s) are on the active medication list: yes  Last refill:  08/13/2020  Future visit scheduled: no  Notes to clinic:  this refill cannot be delegated    Requested Prescriptions  Pending Prescriptions Disp Refills   methimazole (TAPAZOLE) 10 MG tablet 90 tablet 1    Sig: TAKE 1 TABLET (10 MG TOTAL) BY MOUTH 3 (THREE) TIMES DAILY.      Not Delegated - Endocrinology:  Hyperthyroid Agents Failed - 09/22/2020  1:15 PM      Failed - This refill cannot be delegated      Failed - TSH in normal range and within 180 days    TSH  Date Value Ref Range Status  08/23/2020 <0.01 Repeated and verified X2. (L) 0.35 - 4.50 uIU/mL Final          Passed - Valid encounter within last 6 months    Recent Outpatient Visits           3 months ago Hyperthyroidism   [facility] Grayce Sessions, NP   6 months ago Screening for diabetes mellitus   [facility] Grayce Sessions, NP

## 2020-09-23 ENCOUNTER — Other Ambulatory Visit: Payer: Self-pay

## 2020-09-27 ENCOUNTER — Other Ambulatory Visit: Payer: Self-pay

## 2020-10-14 ENCOUNTER — Telehealth (INDEPENDENT_AMBULATORY_CARE_PROVIDER_SITE_OTHER): Payer: Self-pay | Admitting: Primary Care

## 2020-10-14 NOTE — Telephone Encounter (Signed)
I return Pt call, she was reschedule for 10/27/20

## 2020-10-14 NOTE — Telephone Encounter (Signed)
Patient called to change the appt. Time with the financial counselor on 6/27.  Please advise and call patient to discuss at [phone] (H)

## 2020-10-18 ENCOUNTER — Ambulatory Visit: Payer: Medicaid Other

## 2020-10-27 ENCOUNTER — Ambulatory Visit: Payer: Self-pay | Attending: Primary Care

## 2020-10-27 ENCOUNTER — Other Ambulatory Visit: Payer: Self-pay

## 2020-11-04 ENCOUNTER — Ambulatory Visit: Payer: Medicaid Other

## 2021-01-20 ENCOUNTER — Ambulatory Visit (INDEPENDENT_AMBULATORY_CARE_PROVIDER_SITE_OTHER): Payer: Medicaid Other | Admitting: Primary Care

## 2021-02-27 ENCOUNTER — Emergency Department (HOSPITAL_COMMUNITY)
Admission: EM | Admit: 2021-02-27 | Discharge: 2021-03-24 | Disposition: E | Payer: Medicaid Other | Attending: Emergency Medicine | Admitting: Emergency Medicine

## 2021-02-27 DIAGNOSIS — Z87891 Personal history of nicotine dependence: Secondary | ICD-10-CM | POA: Insufficient documentation

## 2021-02-27 DIAGNOSIS — I469 Cardiac arrest, cause unspecified: Secondary | ICD-10-CM | POA: Insufficient documentation

## 2021-02-27 DIAGNOSIS — I10 Essential (primary) hypertension: Secondary | ICD-10-CM | POA: Insufficient documentation

## 2021-02-27 DIAGNOSIS — R140 Abdominal distension (gaseous): Secondary | ICD-10-CM | POA: Insufficient documentation

## 2021-02-27 DIAGNOSIS — Z79899 Other long term (current) drug therapy: Secondary | ICD-10-CM | POA: Insufficient documentation

## 2021-02-27 LAB — I-STAT VENOUS BLOOD GAS, ED
Acid-base deficit: 18 mmol/L — ABNORMAL HIGH (ref 0.0–2.0)
Bicarbonate: 14 mmol/L — ABNORMAL LOW (ref 20.0–28.0)
Calcium, Ion: 1.02 mmol/L — ABNORMAL LOW (ref 1.15–1.40)
HCT: 31 % — ABNORMAL LOW (ref 36.0–46.0)
Hemoglobin: 10.5 g/dL — ABNORMAL LOW (ref 12.0–15.0)
O2 Saturation: 52 %
Potassium: 5.3 mmol/L — ABNORMAL HIGH (ref 3.5–5.1)
Sodium: 141 mmol/L (ref 135–145)
TCO2: 16 mmol/L — ABNORMAL LOW (ref 22–32)
pCO2, Ven: 63.7 mmHg — ABNORMAL HIGH (ref 44.0–60.0)
pH, Ven: 6.95 — CL (ref 7.250–7.430)
pO2, Ven: 44 mmHg (ref 32.0–45.0)

## 2021-02-27 LAB — I-STAT CHEM 8, ED
BUN: 14 mg/dL (ref 6–20)
Calcium, Ion: 1 mmol/L — ABNORMAL LOW (ref 1.15–1.40)
Chloride: 112 mmol/L — ABNORMAL HIGH (ref 98–111)
Creatinine, Ser: 0.6 mg/dL (ref 0.44–1.00)
Glucose, Bld: 33 mg/dL — CL (ref 70–99)
HCT: 31 % — ABNORMAL LOW (ref 36.0–46.0)
Hemoglobin: 10.5 g/dL — ABNORMAL LOW (ref 12.0–15.0)
Potassium: 5.4 mmol/L — ABNORMAL HIGH (ref 3.5–5.1)
Sodium: 141 mmol/L (ref 135–145)
TCO2: 17 mmol/L — ABNORMAL LOW (ref 22–32)

## 2021-02-27 LAB — CBG MONITORING, ED: Glucose-Capillary: 19 mg/dL — CL (ref 70–99)

## 2021-02-27 MED ORDER — CALCIUM CHLORIDE 10 % IV SOLN
INTRAVENOUS | Status: AC | PRN
  Administered 2021-02-27: 1 g via INTRAVENOUS

## 2021-02-27 MED ORDER — EPINEPHRINE 1 MG/10ML IJ SOSY
PREFILLED_SYRINGE | INTRAMUSCULAR | Status: AC | PRN
  Administered 2021-02-27: 1 mg via INTRAVENOUS

## 2021-02-27 MED ORDER — EPINEPHRINE 1 MG/10ML IJ SOSY
PREFILLED_SYRINGE | INTRAMUSCULAR | Status: AC | PRN
  Administered 2021-02-27 (×2): 1 mg via INTRAVENOUS

## 2021-02-27 MED ORDER — DEXTROSE 50 % IV SOLN
INTRAVENOUS | Status: AC | PRN
  Administered 2021-02-27: 1 via INTRAVENOUS

## 2021-03-24 NOTE — ED Notes (Addendum)
ET tube removed via RT and EDP d/t difficulty bagging. Manually bagging pt at this time.

## 2021-03-24 NOTE — Code Documentation (Addendum)
Patient arrived by [facility] from home following witnessed cardiac arrest. Family started cpr. Fire found in fib and shocked x 1. Ems ARRIVED and administered 4 additional shocks for V. Fib. Patient received epi x 3 and arrived on Epi drip at . Also received amiodarone 450mg  pta. Arrived with 7.0 ETT in place and IO right tib/fib. Ns 500CC bolus administered prior to arrival. Was externally paced by EMS. On arrival to trauma bay no pulse noted and CPR started immediately.  CPR ongoing 30 plus minutes prior to patients arrival. Reported CBG 86 prior to arrival

## 2021-03-24 NOTE — Care Management (Signed)
Called daughter left a confidential message, she returned call and stated cousin  Sariyah Corcino was on the way up. I asked if she could come up as well, she will be there shortly MD and RN notified

## 2021-03-24 NOTE — ED Provider Notes (Addendum)
[facility] EMERGENCY DEPARTMENT Provider Note   CSN: [id number] Arrival date & time: 2021/03/02  1228     History No chief complaint on file.   Courtney Lam is a 50 y.o. female.  The history is provided by the EMS personnel.  Cardiac Arrest Witnessed by:  Bystander Incident location:  Home Time before ALS initiated:  3-5 minutes Condition upon EMS arrival:  Unresponsive Pulse:  Absent Initial cardiac rhythm per EMS:  Ventricular fibrillation Treatments prior to arrival:  ACLS protocol, AED discharged and intubation Number of shocks delivered: multiple. Defibrillation successful: yes   Rhythm after defibrillation:  Paced Airway:  Intubation prior to arrival Rhythm on admission to ED:  Asystole     Past Medical History:  Diagnosis Date   Chest pain    Hypertension    Hyperthyroidism    SOB (shortness of breath)     Patient Active Problem List   Diagnosis Date Noted   Chest pain on breathing 01/01/2020   Angina pectoris (HCC) 01/01/2020   Shortness of breath 01/01/2020   Hyperthyroidism 01/01/2020    Past Surgical History:  Procedure Laterality Date   ECTOPIC PREGNANCY SURGERY       OB History   No obstetric history on file.     Family History  Problem Relation Age of Onset   Cancer Mother 35   High blood pressure Father    Diabetes Father    Asthma Sister    Thyroid disease Brother    Thyroid disease Sister     Social History   Tobacco Use   Smoking status: Former   Smokeless tobacco: Never  Substance Use Topics   Alcohol use: Not Currently   Drug use: Not Currently    Home Medications Prior to Admission medications   Medication Sig Start Date End Date Taking? Authorizing Provider  acetaminophen (TYLENOL) 500 MG tablet Take 1,000 mg by mouth every 6 (six) hours as needed (for dental pain).    [provider]  amLODipine (NORVASC) 5 MG tablet TAKE 1 TABLET (5 MG TOTAL) BY MOUTH DAILY. 03/11/20 03/11/21  Grayce Sessions, NP  methimazole (TAPAZOLE) 10 MG tablet TAKE 1 TABLET (10 MG TOTAL) BY MOUTH 3 (THREE) TIMES DAILY. 06/23/20 06/23/21  Grayce Sessions, NP  metoprolol tartrate (LOPRESSOR) 25 MG tablet Take 1 tablet (25 mg total) by mouth 2 (two) times daily. 03/11/20   Grayce Sessions, NP  triamcinolone ointment (KENALOG) 0.1 % Apply 1 application topically See admin instructions. Apply to affected areas daily as needed/as directed 08/11/19   [provider]    Allergies    Patient has no known allergies.  Review of Systems   Review of Systems  Unable to perform ROS: Acuity of condition   Physical Exam Updated Vital Signs BP (!) 0/0   Pulse (!) 0   Resp (!) 0   Ht 5\' 6"  (1.676 m)   Wt 95.9 kg   SpO2 (!) 0%   BMI 34.12 kg/m   Physical Exam Constitutional:      General: She is in acute distress.     Appearance: She is ill-appearing.  HENT:     Head: Normocephalic.     Mouth/Throat:     Mouth: Mucous membranes are moist.  Eyes:     Conjunctiva/sclera: Conjunctivae normal.     Comments: Pupils fixed and dilated nonreactive  Cardiovascular:     Pulses:          Carotid pulses are 0  on the right side and 0 on the left side.      Radial pulses are 0 on the right side and 0 on the left side.       Femoral pulses are 0 on the right side and 0 on the left side. Pulmonary:     Comments: Diminished breath sounds throughout, difficult to bag, end-tidal CO2 without change Abdominal:     General: There is distension.  Musculoskeletal:        General: No deformity.     Cervical back: Neck supple.  Skin:    Findings: No bruising or rash.  Neurological:     GCS: GCS eye subscore is 1. GCS verbal subscore is 1. GCS motor subscore is 1.     Comments: ET tube in place upon arrival to ED    ED Results / Procedures / Treatments   Labs (all labs ordered are listed, but only abnormal results are displayed) Labs Reviewed  CBG MONITORING, ED - Abnormal; Notable for the  following components:      Result Value   Glucose-Capillary 19 (*)    All other components within normal limits  I-STAT VENOUS BLOOD GAS, ED - Abnormal; Notable for the following components:   pH, Ven 6.950 (*)    pCO2, Ven 63.7 (*)    Bicarbonate 14.0 (*)    TCO2 16 (*)    Acid-base deficit 18.0 (*)    Potassium 5.3 (*)    Calcium, Ion 1.02 (*)    HCT 31.0 (*)    Hemoglobin 10.5 (*)    All other components within normal limits  I-STAT CHEM 8, ED - Abnormal; Notable for the following components:   Potassium 5.4 (*)    Chloride 112 (*)    Glucose, Bld 33 (*)    Calcium, Ion 1.00 (*)    TCO2 17 (*)    Hemoglobin 10.5 (*)    HCT 31.0 (*)    All other components within normal limits  I-STAT CHEM 8, ED  I-STAT VENOUS BLOOD GAS, ED    EKG EKG Interpretation  Date/Time:  Sunday 03-01-21 12:43:31 EST Ventricular Rate:  0 PR Interval:    QRS Duration:   QT Interval:    QTC Calculation:   R Axis:   0 Text Interpretation: Uncertain rhythm: review No further analysis attempted - not enough leads could be measured Confirmed by Virgina Norfolk ([id number]) on 03/01/21 12:45:33 PM  Radiology No results found.  Procedures .Critical Care Performed by: Virgina Norfolk, DO Authorized by: Virgina Norfolk, DO   Critical care provider statement:    Critical care time (minutes):  40   Critical care was necessary to treat or prevent imminent or life-threatening deterioration of the following conditions:  Cardiac failure   Critical care was time spent personally by me on the following activities:  Obtaining history from patient or surrogate, interpretation of cardiac output measurements, ordering and performing treatments and interventions, pulse oximetry, examination of patient and evaluation of patient's response to treatment   Care discussed with: admitting provider     Cardiopulmonary Resuscitation (CPR) Procedure Note Directed/Performed by: Virgina Norfolk I personally directed  ancillary staff and/or performed CPR in an effort to regain return of spontaneous circulation and to maintain cardiac, neuro and systemic perfusion.   Medications Ordered in ED Medications  EPINEPHrine (ADRENALIN) 1 MG/10ML injection (1 mg Intravenous Given 2021/03/01 1230)  calcium chloride injection (1 g Intravenous Given 2021/03/01 1232)  EPINEPHrine (ADRENALIN) 1 MG/10ML injection (1  mg Intravenous Given 03/13/21 1236)  dextrose 50 % solution (1 ampule Intravenous Given 03/13/21 1235)    ED Course  I have reviewed the triage vital signs and the nursing notes.  Pertinent labs & imaging results that were available during my care of the patient were reviewed by me and considered in my medical decision making (see chart for details).    MDM Rules/Calculators/A&P                           Courtney Lam is a 50 year old female with history of thyroid disease who presents the ED after cardiac arrest.  Per EMS report patient had a witnessed arrest with about 5-minute downtime until CPR was initiated by first responders.  Supposedly had shockable rhythm.  Patient was evaluated by EMS shortly thereafter and patient had CPR continued with shockable rhythm.  Epinephrine and amiodarone were given.  Return of spontaneous circulation was achieved after 20 minutes of CPR and ACLS meds.  Patient was intubated in the field by EMS as well with a 7 ET tube.  EMS states that shortly after return of spontaneous circulation they started to notice bradycardia and started to pace the patient.  Last pulse check several minutes ago supposedly that had a pulse.  Upon arrival here patient has no pulses and CPR was initiated, asytole on monitor.  She had diminished breath sounds.  It was difficult to bag her through ET tube.  End-tidal CO2 monitoring was not picking up throughout intial portion of our resucitiation.  Patient was switched to BVM and ET tube removed. Multiple rounds of compression, epinephrine, calcium, dextrose were  given.  Blood sugar with EMS was in the 130s.  After about 10 minutes or so of CPR patient was pronounced expired at 1233.  I-STAT labs were obtained during resuscitation. Blood sugar critically low at 33.  pH 6.9.  She had almost 1 hour of resuscitation.  She had no cardiac activity on echocardiogram.  Low sugar likely in the setting of critical illness.  She had no history of diabetes.  No signs of external trauma on exam.  Have not been able to get in touch with family to get more history.  I discussed case with the medical examiner Dr. Randa Evens who will make her medical examiner case.  No known cardiac history as well.  Patient only had asystole on the monitor throughout my resuscitation.  We will continue to work with social work and case management to try to make a family member aware of her passing.  I was connected via my Diplomatic Services operational officer to a person who identified himself as patient's husband.  Supposedly daughter was also on the phone.  I made them aware of her passing.  However our connection got caught.  We will continue to reach out to them.  I was able to talk to family and fully update about plan and medical examiner plan.  This chart was dictated using voice recognition software.  Despite best efforts to proofread,  errors can occur which can change the documentation meaning.   Final Clinical Impression(s) / ED Diagnoses Final diagnoses:  Cardiac arrest [facility])    Rx / DC Orders ED Discharge Orders     None        Virgina Norfolk, DO 2021-03-13 1400    Virgina Norfolk, DO 03/13/21 1408    Takirah Binford, DO 03-13-2021 1416

## 2021-03-24 DEATH — deceased

## 2021-08-19 IMAGING — CT CT CHEST W/ CM
2 of 4 series · 15 of 36 positions shown, 18 images · IV contrast (Omni 300)
Comparison: Chest x-ray on [DATE]

CLINICAL DATA: Neck mass.  Initial workup.

EXAM:
CT CHEST WITH CONTRAST
TECHNIQUE: Multidetector CT imaging of the chest was performed during
intravenous contrast administration.
CONTRAST:  75mL OMNIPAQUE IOHEXOL 300 MG/ML  SOLN

[Series 3: chest with 2mm st · axial · 0.90mm/px · z∈[+1106,+1420]mm · 12 of 183 slices shown, 15 images]
[im 14/183  mediastinal]
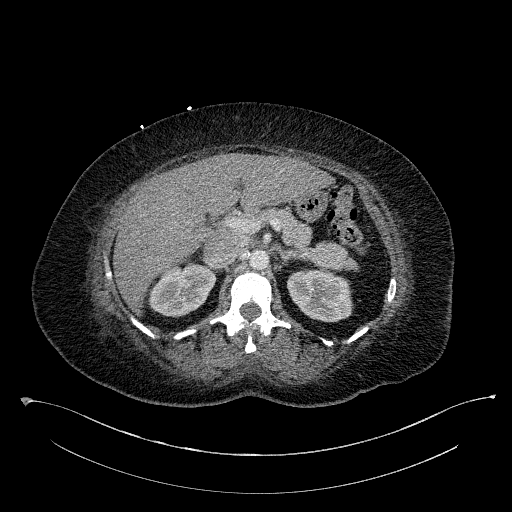
[im 14/183  lung]
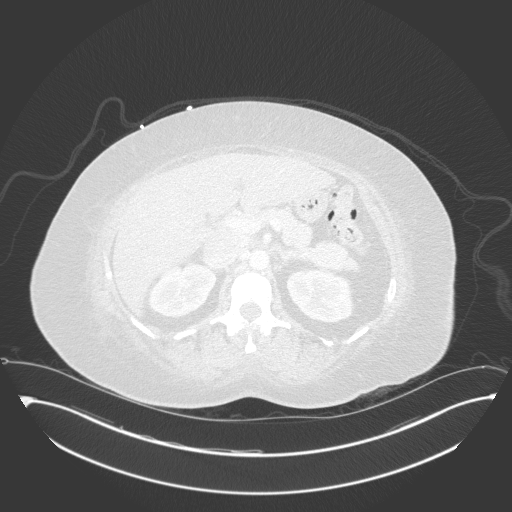
[im 27/183  lung]
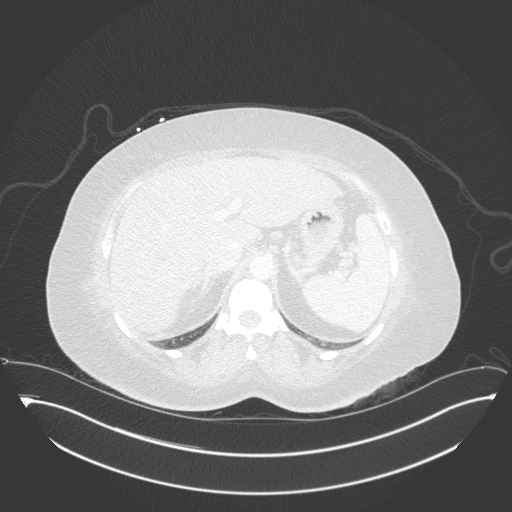
[im 40/183  lung]
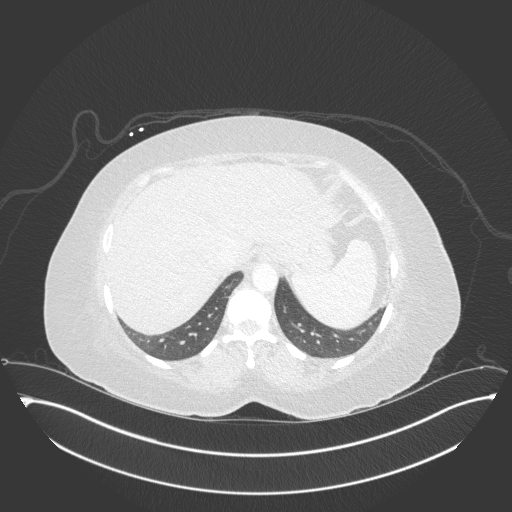
[im 53/183  lung]
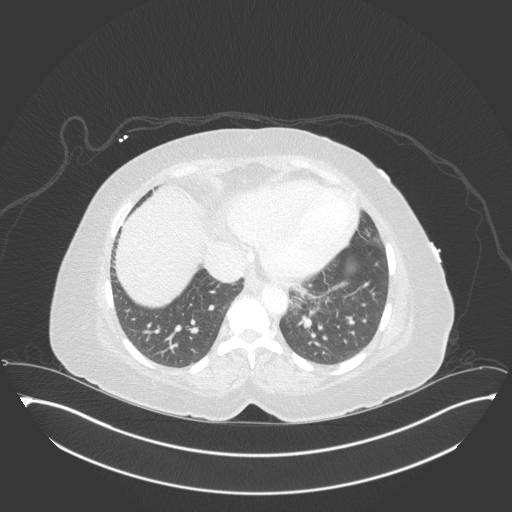
[im 66/183  mediastinal]
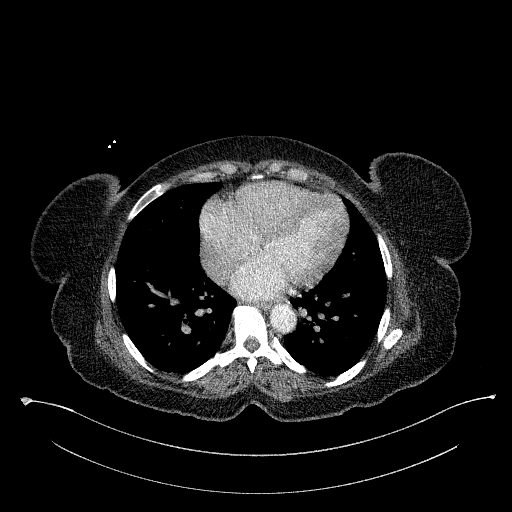
[im 66/183  lung]
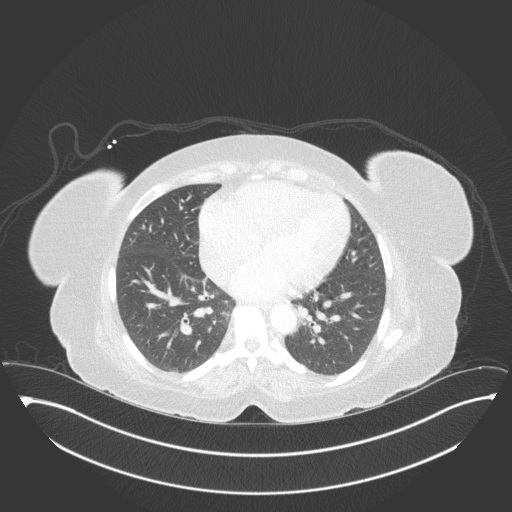
[im 79/183  lung]
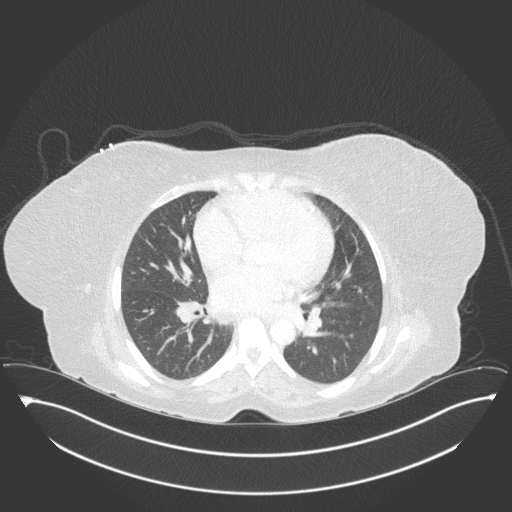
[im 105/183  lung]
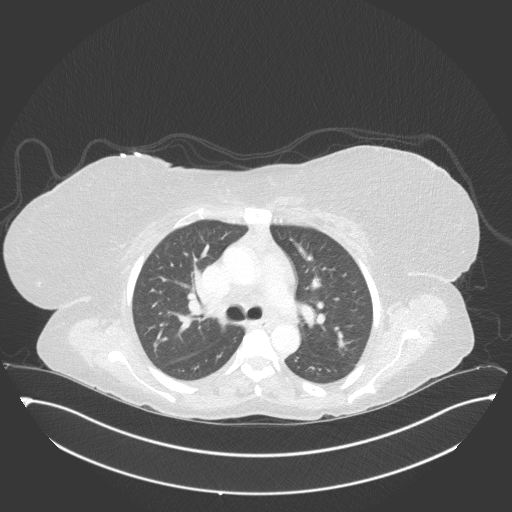
[im 118/183  lung]
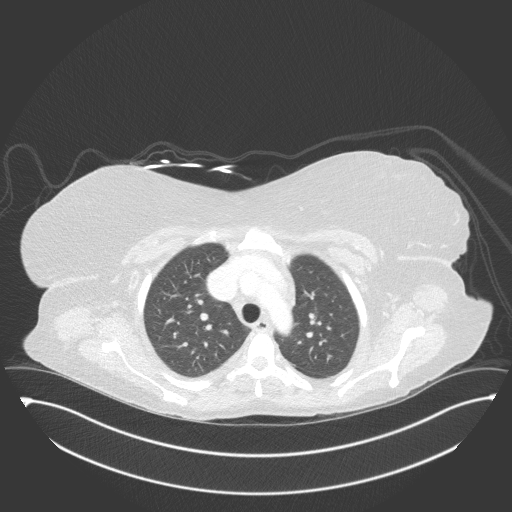
[im 131/183  mediastinal]
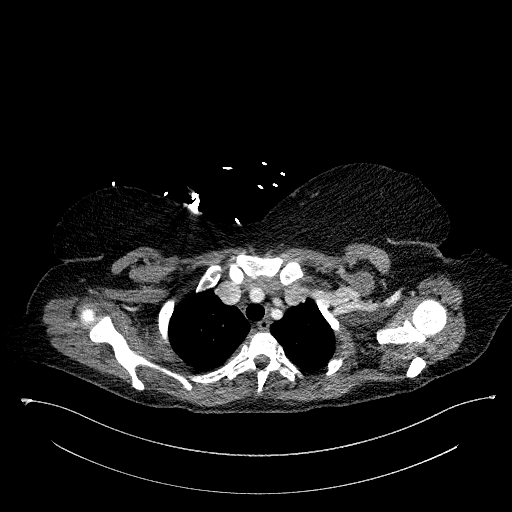
[im 131/183  lung]
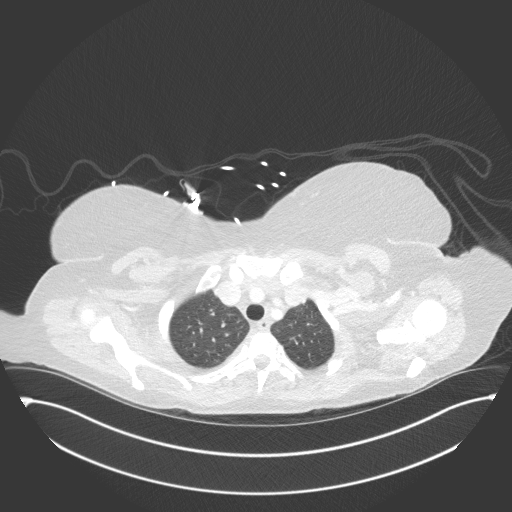
[im 144/183  lung]
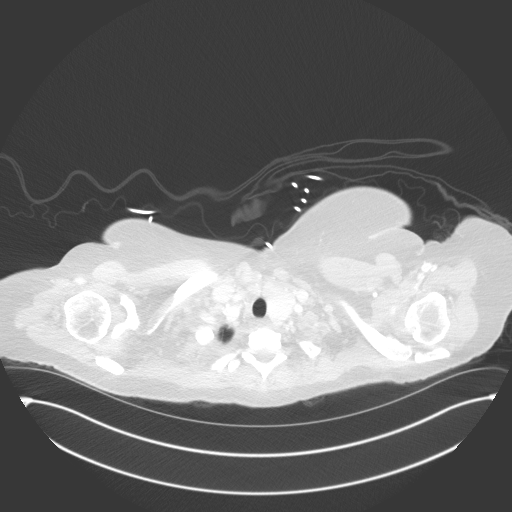
[im 157/183  lung]
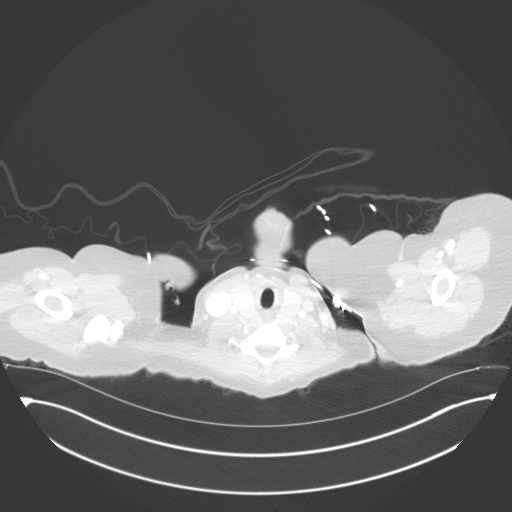
[im 170/183  lung]
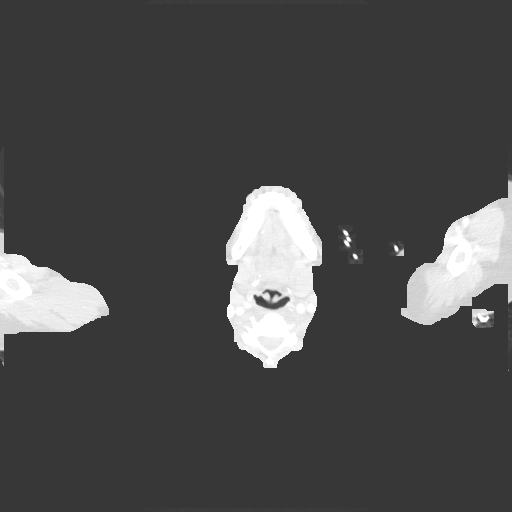

[Series 6: chest with 2mm st cor · coronal · 0.79mm/px · 3 of 160 slices shown]
[im 32/160  lung]
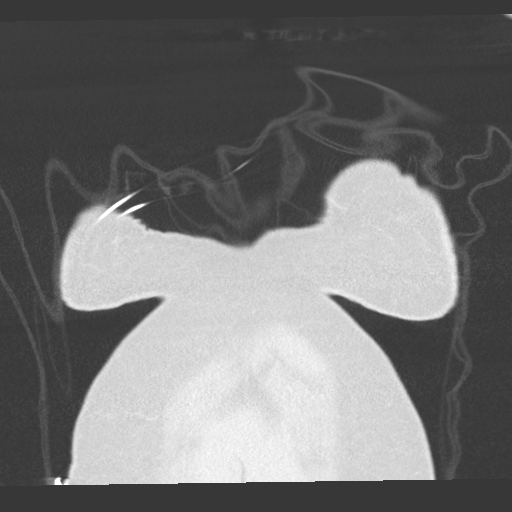
[im 64/160  lung]
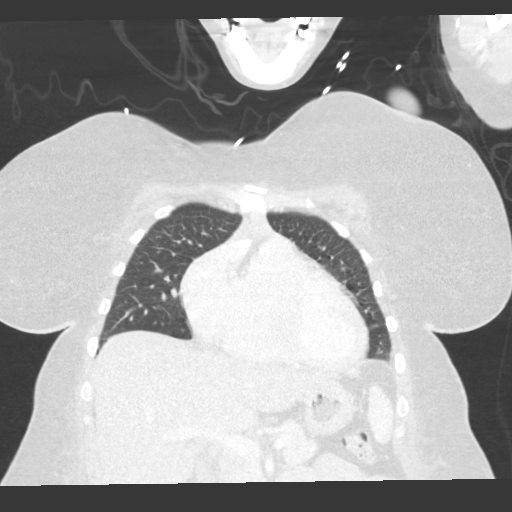
[im 96/160  lung]
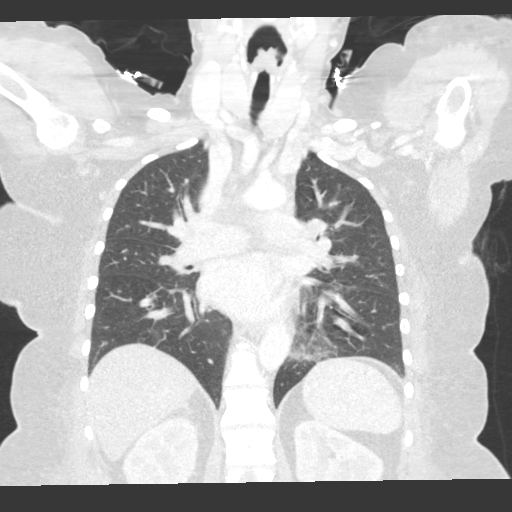

[15 of 36 positions shown; findings below may reference images not displayed]

FINDINGS: Cardiovascular: Heart size is normal. There is minimal
atherosclerotic calcification of the coronary arteries. No
pericardial effusion. The thoracic aorta and pulmonary arteries have
normal appearance accounting for the contrast bolus timing.

Mediastinum/Nodes: There is soft tissue density in the prevascular
region consistent with thymic enlargement or adenopathy. This
density measures 2.2 x 5.5 centimeters on image 75 of series 3. No
significant hilar or axillary adenopathy.

The thyroid is enlarged, measuring at least 5.4 x 6.2 centimeters
with thickened isthmus. No discrete thyroid lesion identified.
Thyroid is not contiguous with the substernal mass. Esophagus is
normal in appearance.

Lungs/Pleura: There is minimal subsegmental atelectasis at the LEFT
lung base. No pleural effusions or consolidations. No suspicious
nodules. No edema.

Upper Abdomen: No acute abnormality.

Musculoskeletal: No chest wall abnormality. No acute or significant
osseous findings.
IMPRESSION: 1. Enlarged thyroid gland with thickened isthmus. No discrete
thyroid lesion identified. Findings are consistent with goiter.
2. Recommend thyroid US (ref: [HOSPITAL]. [DATE]):
3. Thymic enlargement or adenopathy in the anterior mediastinum.
Recommend further evaluation with PET-CT.
4. Consider further evaluation with thyroid ultrasound.
5. Coronary artery disease.
6. Aortic Atherosclerosis ([id number]).
